# Patient Record
Sex: Female | Born: 1990 | Race: White | Hispanic: No | Marital: Single | Smoking: Current some day smoker
Health system: Southern US, Community
[De-identification: ages and names within clinical notes are randomized; demographics above are authoritative.]

## PROBLEM LIST (undated history)

## (undated) DIAGNOSIS — Z789 Other specified health status: Secondary | ICD-10-CM

---
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
## 2009-01-28 HISTORY — PX: APPENDECTOMY: SHX54

## 2009-06-12 DIAGNOSIS — Z9889 Other specified postprocedural states: Secondary | ICD-10-CM

## 2009-06-19 ENCOUNTER — Ambulatory Visit: Payer: Self-pay | Admitting: Obstetrics & Gynecology

## 2009-06-20 ENCOUNTER — Ambulatory Visit: Payer: Self-pay | Admitting: Obstetrics & Gynecology

## 2010-02-08 ENCOUNTER — Observation Stay (HOSPITAL_COMMUNITY): Admission: EM | Admit: 2010-02-08 | Discharge: 2010-02-09 | Payer: Self-pay | Source: Home / Self Care

## 2010-02-09 ENCOUNTER — Encounter (INDEPENDENT_AMBULATORY_CARE_PROVIDER_SITE_OTHER): Payer: Self-pay

## 2010-02-12 LAB — COMPREHENSIVE METABOLIC PANEL
ALT: 14 U/L (ref 0–35)
AST: 13 U/L (ref 0–37)
Albumin: 3.5 g/dL (ref 3.5–5.2)
Alkaline Phosphatase: 64 U/L (ref 39–117)
BUN: 8 mg/dL (ref 6–23)
CO2: 24 mEq/L (ref 19–32)
Calcium: 8.3 mg/dL — ABNORMAL LOW (ref 8.4–10.5)
Chloride: 109 mEq/L (ref 96–112)
Creatinine, Ser: 0.63 mg/dL (ref 0.4–1.2)
GFR calc Af Amer: >60 mL/min (ref >60)
GFR calc non Af Amer: >60 mL/min (ref >60)
Glucose, Bld: 90 mg/dL (ref 70–99)
Potassium: 3.7 mEq/L (ref 3.5–5.1)
Sodium: 138 mEq/L (ref 135–145)
Total Bilirubin: 0.7 mg/dL (ref 0.3–1.2)
Total Protein: 6.2 g/dL (ref 6.0–8.3)

## 2010-02-12 LAB — CBC
HCT: 33.3 % — ABNORMAL LOW (ref 36.0–46.0)
Hemoglobin: 11.9 g/dL — ABNORMAL LOW (ref 12.0–15.0)
MCH: 32.8 pg (ref 26.0–34.0)
MCHC: 35.7 g/dL (ref 30.0–36.0)
MCV: 91.7 fL (ref 78.0–100.0)
Platelets: 162 10*3/uL (ref 150–400)
RBC: 3.63 MIL/uL — ABNORMAL LOW (ref 3.87–5.11)
RDW: 11.5 % (ref 11.5–15.5)
WBC: 10.5 10*3/uL (ref 4.0–10.5)

## 2010-02-12 LAB — URINALYSIS, ROUTINE W REFLEX MICROSCOPIC
Bilirubin Urine: NEGATIVE
Hgb urine dipstick: NEGATIVE
Ketones, ur: 15 mg/dL — AB
Nitrite: NEGATIVE
Protein, ur: NEGATIVE mg/dL
Specific Gravity, Urine: 1.033 — ABNORMAL HIGH (ref 1.005–1.030)
Urine Glucose, Fasting: NEGATIVE mg/dL
Urobilinogen, UA: 0.2 mg/dL (ref 0.0–1.0)
pH: 7 (ref 5.0–8.0)

## 2010-02-12 LAB — DIFFERENTIAL
Basophils Absolute: 0 10*3/uL (ref 0.0–0.1)
Basophils Relative: 0 % (ref 0–1)
Eosinophils Absolute: 0 10*3/uL (ref 0.0–0.7)
Eosinophils Relative: 0 % (ref 0–5)
Lymphocytes Relative: 7 % — ABNORMAL LOW (ref 12–46)
Lymphs Abs: 0.8 10*3/uL (ref 0.7–4.0)
Monocytes Absolute: 0.8 10*3/uL (ref 0.1–1.0)
Monocytes Relative: 8 % (ref 3–12)
Neutro Abs: 8.9 10*3/uL — ABNORMAL HIGH (ref 1.7–7.7)
Neutrophils Relative %: 85 % — ABNORMAL HIGH (ref 43–77)

## 2010-02-12 LAB — POCT PREGNANCY, URINE: Preg Test, Ur: NEGATIVE

## 2010-02-23 ENCOUNTER — Inpatient Hospital Stay (HOSPITAL_COMMUNITY): Admission: EM | Admit: 2010-02-23 | Discharge: 2010-02-26 | Payer: Self-pay | Source: Home / Self Care

## 2010-02-23 LAB — COMPREHENSIVE METABOLIC PANEL
ALT: 10 U/L (ref 0–35)
AST: 14 U/L (ref 0–37)
Albumin: 3.3 g/dL — ABNORMAL LOW (ref 3.5–5.2)
Alkaline Phosphatase: 97 U/L (ref 39–117)
BUN: 5 mg/dL — ABNORMAL LOW (ref 6–23)
CO2: 25 mEq/L (ref 19–32)
Calcium: 9.2 mg/dL (ref 8.4–10.5)
Chloride: 98 mEq/L (ref 96–112)
Creatinine, Ser: 0.73 mg/dL (ref 0.4–1.2)
GFR calc Af Amer: >60 mL/min (ref >60)
GFR calc non Af Amer: >60 mL/min (ref >60)
Glucose, Bld: 83 mg/dL (ref 70–99)
Potassium: 3 mEq/L — ABNORMAL LOW (ref 3.5–5.1)
Sodium: 136 mEq/L (ref 135–145)
Total Bilirubin: 0.4 mg/dL (ref 0.3–1.2)
Total Protein: 7.7 g/dL (ref 6.0–8.3)

## 2010-02-23 LAB — DIFFERENTIAL
Basophils Absolute: 0.1 10*3/uL (ref 0.0–0.1)
Basophils Relative: 1 % (ref 0–1)
Eosinophils Absolute: 0.1 10*3/uL (ref 0.0–0.7)
Eosinophils Relative: 1 % (ref 0–5)
Lymphocytes Relative: 11 % — ABNORMAL LOW (ref 12–46)
Lymphs Abs: 1.1 10*3/uL (ref 0.7–4.0)
Monocytes Absolute: 1 10*3/uL (ref 0.1–1.0)
Monocytes Relative: 10 % (ref 3–12)
Neutro Abs: 7.9 10*3/uL — ABNORMAL HIGH (ref 1.7–7.7)
Neutrophils Relative %: 77 % (ref 43–77)

## 2010-02-23 LAB — URINALYSIS, ROUTINE W REFLEX MICROSCOPIC
Ketones, ur: 15 mg/dL — AB
Leukocytes, UA: NEGATIVE
Nitrite: NEGATIVE
Protein, ur: NEGATIVE mg/dL
Specific Gravity, Urine: 1.027 (ref 1.005–1.030)
Urine Glucose, Fasting: NEGATIVE mg/dL
Urobilinogen, UA: 1 mg/dL (ref 0.0–1.0)
pH: 5.5 (ref 5.0–8.0)

## 2010-02-23 LAB — URINE MICROSCOPIC-ADD ON

## 2010-02-23 LAB — CBC
HCT: 41.8 % (ref 36.0–46.0)
Hemoglobin: 14.9 g/dL (ref 12.0–15.0)
MCH: 32.2 pg (ref 26.0–34.0)
MCHC: 35.6 g/dL (ref 30.0–36.0)
MCV: 90.3 fL (ref 78.0–100.0)
Platelets: 231 10*3/uL (ref 150–400)
RBC: 4.63 MIL/uL (ref 3.87–5.11)
RDW: 11.4 % — ABNORMAL LOW (ref 11.5–15.5)
WBC: 10.2 10*3/uL (ref 4.0–10.5)

## 2010-02-24 LAB — URINE CULTURE
Colony Count: NO GROWTH
Culture  Setup Time: 201201271740
Culture: NO GROWTH

## 2010-02-24 LAB — BASIC METABOLIC PANEL
BUN: 3 mg/dL — ABNORMAL LOW (ref 6–23)
CO2: 24 mEq/L (ref 19–32)
Calcium: 8.9 mg/dL (ref 8.4–10.5)
Chloride: 100 mEq/L (ref 96–112)
Creatinine, Ser: 0.7 mg/dL (ref 0.4–1.2)
GFR calc Af Amer: >60 mL/min (ref >60)
GFR calc non Af Amer: >60 mL/min (ref >60)
Glucose, Bld: 88 mg/dL (ref 70–99)
Potassium: 3.5 mEq/L (ref 3.5–5.1)
Sodium: 135 mEq/L (ref 135–145)

## 2010-02-25 LAB — COMPREHENSIVE METABOLIC PANEL
ALT: 10 U/L (ref 0–35)
AST: 15 U/L (ref 0–37)
Albumin: 2.3 g/dL — ABNORMAL LOW (ref 3.5–5.2)
Alkaline Phosphatase: 69 U/L (ref 39–117)
BUN: <1 mg/dL — ABNORMAL LOW (ref 6–23)
CO2: 23 mEq/L (ref 19–32)
Calcium: 8.4 mg/dL (ref 8.4–10.5)
Chloride: 104 mEq/L (ref 96–112)
Creatinine, Ser: 0.67 mg/dL (ref 0.4–1.2)
GFR calc Af Amer: >60 mL/min (ref >60)
GFR calc non Af Amer: >60 mL/min (ref >60)
Glucose, Bld: 105 mg/dL — ABNORMAL HIGH (ref 70–99)
Potassium: 3.8 mEq/L (ref 3.5–5.1)
Sodium: 136 mEq/L (ref 135–145)
Total Bilirubin: 0.7 mg/dL (ref 0.3–1.2)
Total Protein: 5.9 g/dL — ABNORMAL LOW (ref 6.0–8.3)

## 2010-02-25 LAB — CBC
HCT: 26.7 % — ABNORMAL LOW (ref 36.0–46.0)
Hemoglobin: 9.2 g/dL — ABNORMAL LOW (ref 12.0–15.0)
MCH: 31.7 pg (ref 26.0–34.0)
MCHC: 34.5 g/dL (ref 30.0–36.0)
MCV: 92.1 fL (ref 78.0–100.0)
Platelets: 200 10*3/uL (ref 150–400)
RBC: 2.9 MIL/uL — ABNORMAL LOW (ref 3.87–5.11)
RDW: 11.5 % (ref 11.5–15.5)
WBC: 5.8 10*3/uL (ref 4.0–10.5)

## 2010-02-26 LAB — CULTURE, ROUTINE-ABSCESS

## 2010-02-27 NOTE — H&P (Signed)
  NAME:  Darlene Leonard, Darlene Leonard NO.:  [id number]  MEDICAL RECORD NO.:  [id number]          PATIENT TYPE:  INP  LOCATION:  5157                         FACILITY:  [facility]  PHYSICIAN:  Adolph Pollack, M.D.DATE OF BIRTH:  05-07-1990  DATE OF ADMISSION:  02/23/2010 DATE OF DISCHARGE:                             HISTORY & PHYSICAL   REASON FOR ADMISSION:  Pelvic abscess.  HISTORY:  This is a 20 year old female who underwent a laparoscopic appendectomy for acute appendicitis on February 09, 2010.  Four days ago, she developed some nighttime fever, poor appetite, rectal urgency, and lower abdominal pain which has persisted.  She had a temperature of 102 degrees.  She presented to the urgent office and was seen and sent over to the emergency department for evaluation and CT scan and laboratory. CT scan is consistent with a pelvic abscess.  PAST MEDICAL HISTORY:  Appendicitis.  PREVIOUS OPERATIONS:  Laparoscopic appendectomy, D and C.  ALLERGIES:  None.  MEDICATIONS:  Tylenol p.r.n.  SOCIAL HISTORY:  She works at [employer].  Does smoke cigarettes.  Rare alcohol use.  REVIEW OF SYSTEMS:  There has been no dysuria or hematuria.  No vomiting.  She is having some liquid bowel movements but feels a sense of rectal urgency most of the time.  She tried enema on Wednesday and that is when the liquid bowel movement started.  She had been fairly constipated up to that time.  PHYSICAL EXAMINATION:  GENERAL:  A well-developed, well-nourished female, who is pleasant and cooperative and in no acute distress. VITAL SIGNS:  Temperature is 100.4, blood pressure is 116/77, pulse 98, respiratory rate 20, and O2 saturation 100% on room air. NECK:  Supple without mass. RESPIRATORY:  Breath sounds are equal and clear.  Respirations are unlabored. CARDIOVASCULAR:  Heart demonstrates regular rate, regular rhythm.  I hear no murmur. ABDOMEN:  Soft with well-healed subumbilical, right upper  quadrant, and left lower quadrant scars.  There is some mild lower abdominal tenderness, but no guarding and active bowel sounds are noted. MUSCULOSKELETAL:  She has no edema and good range of motion.  LABORATORY DATA:  White cell count is 10,200, hemoglobin 14.9. Potassium of 3.0, rest of electrolytes within normal limits.  Urinalysis negative.  CT scan demonstrates a multiloculated fluid collection in the pelvis of which largest collection being about 6 cm.  IMPRESSION: 1. Postoperative pelvic abscess. 2. Hypokalemia.  PLAN:  We will admit to the hospital and start her on IV antibiotics. We will obtain an Interventional Radiology consult for percutaneous drainage and I have discussed this with the radiologist ready.  We will correct the potassium deficit.  I have discussed the plan with her and her family.  If the drainage procedure is unable to be done, they realize that she may need a repeat operation to drain the abscess.     Adolph Pollack, M.D.     Kari Baars  D:  02/23/2010  T:  02/24/2010  Job:  [id number] Electronically Signed by Avel Peace M.D. on 02/27/2010 09:22:30 AM

## 2010-03-01 ENCOUNTER — Other Ambulatory Visit (HOSPITAL_COMMUNITY): Payer: Self-pay | Admitting: General Surgery

## 2010-03-01 DIAGNOSIS — L0291 Cutaneous abscess, unspecified: Secondary | ICD-10-CM

## 2010-03-04 LAB — ANAEROBIC CULTURE

## 2010-03-05 ENCOUNTER — Other Ambulatory Visit (HOSPITAL_COMMUNITY): Payer: Self-pay | Admitting: General Surgery

## 2010-03-05 ENCOUNTER — Other Ambulatory Visit (HOSPITAL_COMMUNITY): Payer: Self-pay

## 2010-03-05 ENCOUNTER — Ambulatory Visit (HOSPITAL_COMMUNITY)
Admission: RE | Admit: 2010-03-05 | Discharge: 2010-03-05 | Disposition: A | Discharge status: Home / Self Care | Payer: BC Managed Care – PPO | Source: Ambulatory Visit | Attending: General Surgery | Admitting: General Surgery

## 2010-03-05 DIAGNOSIS — L0291 Cutaneous abscess, unspecified: Secondary | ICD-10-CM

## 2010-03-05 DIAGNOSIS — Z438 Encounter for attention to other artificial openings: Secondary | ICD-10-CM | POA: Insufficient documentation

## 2010-03-05 MED ORDER — IOHEXOL 300 MG/ML  SOLN
80.0000 mL | Freq: Once | INTRAMUSCULAR | Status: AC | PRN
  Administered 2010-03-05: 80 mL via INTRAVENOUS

## 2010-03-22 NOTE — Discharge Summary (Signed)
  NAME:  Darlene Leonard, Darlene Leonard NO.:  [id number]  MEDICAL RECORD NO.:  [id number]           PATIENT TYPE:  LOCATION:                                 FACILITY:  PHYSICIAN:  Velora Heckler, MD      DATE OF BIRTH:  1990/11/01  DATE OF ADMISSION: DATE OF DISCHARGE:                              DISCHARGE SUMMARY   DATE OF ADMISSION:  February 23, 2010.  DATE OF DISCHARGE:  February 26, 2010.  HISTORY OF PRESENT ILLNESS:  [name] is a 20 year old female who underwent laparoscopic appendectomy, originally on January 13.  She was discharged appropriately and seen in a postop manner without any significant complaints.  However, she did develop some nighttime fevers that turned into consistent fevers as well as the addition of lower abdominal discomfort.  She re-presented to the office with these complaints and was sent over to the emergency department for CT scan and evaluation.  The CT scan showed evidence consistent with a pelvic abscess, and decision was made to admit the patient by Dr. Abbey Chatters on February 23, 2010.  SUMMARY OF HOSPITAL COURSE:  After admission on January 27, the patient was consulted by Interventional Radiology for possible percutaneous drainage placement.  This was accomplished the following day, on January 28, and a drain into the pelvic abscess was accomplished.  The patient felt considerably better.  Post procedure, her fevers subsided and her white blood cell count normalized.  Her diet was started and advanced to the point where that as of February 26, 2010, she was tolerating a regular diet, having good bowel function, minimal pain.  She was afebrile.  Her white blood cell count had normalized.  At this point, we felt the patient was appropriate for discharge, with the drain intact and with plans for CT scan and of drain injection to be done as an outpatient.  DISCHARGE DIAGNOSES: 1. Status post laparoscopic appendectomy with postop pelvic  abscess. 2. Status post percutaneous drainage of pelvic abscess.  DISCHARGE/PLAN:  The patient will be given prescription for antibiotics as well as a prescription for Percocet 1-2 tablets q.4-6 h. p.r.n. pain. She is given instructions and advised that she will be contacted for scheduling of her CT scan/drain injection by our office.  She is otherwise scheduled to come back in the office after her imaging study is completed.     Brayton El, PA-C   ______________________________ Velora Heckler, MD    KB/MEDQ  D:  03/12/2010  T:  03/13/2010  Job:  [id number]  Electronically Signed by Brayton El  on 03/15/2010 09:16:34 AM Electronically Signed by Darnell Level MD on 03/22/2010 10:30:49 AM

## 2012-03-17 ENCOUNTER — Ambulatory Visit: Payer: BC Managed Care – PPO | Admitting: Internal Medicine

## 2014-01-28 LAB — HM PAP SMEAR: HM Pap smear: NORMAL

## 2014-01-28 NOTE — L&D Delivery Note (Signed)
Delivery Note At  a viable female was delivered via  (Presentation:LOA ;  ).  APGAR:8 ,9 ; weight  .   Placenta status: delivered in tact,with 3 vessel  Cord:  with the following complications:none .  Cord pH: NA  Anesthesia:  none Episiotomy:  none Lacerations:  superficial Suture Repair: NA Est. Blood Loss (mL):  250  Mom to stay in L&D for 24 hours for mag administration.  Baby to Couplet care / Skin to Skin.  Melody Burr 09/29/2014, 9:26 PM

## 2014-03-02 LAB — OB RESULTS CONSOLE GC/CHLAMYDIA
Chlamydia: NEGATIVE
Gonorrhea: NEGATIVE

## 2014-03-02 LAB — OB RESULTS CONSOLE ABO/RH: RH TYPE: POSITIVE

## 2014-03-02 LAB — OB RESULTS CONSOLE HGB/HCT, BLOOD
HCT: 37 %
Hemoglobin: 13.2 g/dL

## 2014-03-02 LAB — OB RESULTS CONSOLE RPR: RPR: NONREACTIVE

## 2014-03-02 LAB — OB RESULTS CONSOLE VARICELLA ZOSTER ANTIBODY, IGG: VARICELLA IGG: IMMUNE

## 2014-03-02 LAB — OB RESULTS CONSOLE PLATELET COUNT: PLATELETS: 234 10*3/uL

## 2014-03-02 LAB — OB RESULTS CONSOLE HEPATITIS B SURFACE ANTIGEN: Hepatitis B Surface Ag: NEGATIVE

## 2014-03-02 LAB — OB RESULTS CONSOLE HIV ANTIBODY (ROUTINE TESTING): HIV: NONREACTIVE

## 2014-03-02 LAB — OB RESULTS CONSOLE ANTIBODY SCREEN: Antibody Screen: NEGATIVE

## 2014-03-02 LAB — OB RESULTS CONSOLE RUBELLA ANTIBODY, IGM: RUBELLA: IMMUNE

## 2014-07-06 DIAGNOSIS — O219 Vomiting of pregnancy, unspecified: Secondary | ICD-10-CM | POA: Insufficient documentation

## 2014-07-06 DIAGNOSIS — IMO0002 Reserved for concepts with insufficient information to code with codable children: Secondary | ICD-10-CM | POA: Insufficient documentation

## 2014-07-07 ENCOUNTER — Ambulatory Visit (INDEPENDENT_AMBULATORY_CARE_PROVIDER_SITE_OTHER): Payer: BC Managed Care – PPO | Admitting: Obstetrics and Gynecology

## 2014-07-07 ENCOUNTER — Encounter: Payer: Self-pay | Admitting: Obstetrics and Gynecology

## 2014-07-07 VITALS — BP 130/76 | HR 108 | Wt 141.9 lb

## 2014-07-07 DIAGNOSIS — Z3492 Encounter for supervision of normal pregnancy, unspecified, second trimester: Secondary | ICD-10-CM

## 2014-07-07 DIAGNOSIS — Z3482 Encounter for supervision of other normal pregnancy, second trimester: Secondary | ICD-10-CM

## 2014-07-07 LAB — POCT URINALYSIS DIPSTICK
BILIRUBIN UA: NEGATIVE
Blood, UA: NEGATIVE
GLUCOSE UA: NEGATIVE
KETONES UA: NEGATIVE
Leukocytes, UA: NEGATIVE
NITRITE UA: NEGATIVE
PH UA: 7
Spec Grav, UA: <=1.005
Urobilinogen, UA: 0.2

## 2014-07-07 NOTE — Progress Notes (Signed)
ROB- 24yo MWF, [id number]; doing well w/o concerns; glucola next visit. Enrolled in [employer]; plans to name female infant 'Marlene Bast'

## 2014-07-07 NOTE — Patient Instructions (Signed)
Place 24-38 weeks prenatal visit patient instructions here.

## 2014-07-07 NOTE — Progress Notes (Signed)
Patient is feeling good no complaints

## 2014-07-08 ENCOUNTER — Encounter: Payer: BC Managed Care – PPO | Admitting: Obstetrics and Gynecology

## 2014-07-21 ENCOUNTER — Encounter: Payer: Self-pay | Admitting: Obstetrics and Gynecology

## 2014-07-21 ENCOUNTER — Ambulatory Visit (INDEPENDENT_AMBULATORY_CARE_PROVIDER_SITE_OTHER): Payer: BC Managed Care – PPO | Admitting: Obstetrics and Gynecology

## 2014-07-21 ENCOUNTER — Other Ambulatory Visit: Payer: BC Managed Care – PPO

## 2014-07-21 VITALS — BP 118/76 | HR 97

## 2014-07-21 DIAGNOSIS — Z3493 Encounter for supervision of normal pregnancy, unspecified, third trimester: Secondary | ICD-10-CM

## 2014-07-21 LAB — POCT URINALYSIS DIPSTICK
BILIRUBIN UA: NEGATIVE
Blood, UA: NEGATIVE
GLUCOSE UA: NEGATIVE
KETONES UA: NEGATIVE
LEUKOCYTES UA: NEGATIVE
Nitrite, UA: NEGATIVE
Protein, UA: NEGATIVE
Spec Grav, UA: <=1.005
Urobilinogen, UA: 0.2
pH, UA: 7

## 2014-07-21 MED ORDER — TETANUS-DIPHTH-ACELL PERTUSSIS 5-2.5-18.5 LF-MCG/0.5 IM SUSP
0.5000 mL | Freq: Once | INTRAMUSCULAR | Status: AC
  Administered 2014-07-21: 0.5 mL via INTRAMUSCULAR

## 2014-07-21 NOTE — Progress Notes (Signed)
ROB & Glucola- doing well, Tdap given & blood consent signed; discussed increased progesterone and blood volume in pregnancy and it's effects on swelling and 'feeling pulse'

## 2014-07-21 NOTE — Progress Notes (Signed)
Pt states she can feel her pulse in her ears, head, legs

## 2014-07-22 ENCOUNTER — Other Ambulatory Visit: Payer: Self-pay | Admitting: Obstetrics and Gynecology

## 2014-07-22 DIAGNOSIS — O99810 Abnormal glucose complicating pregnancy: Secondary | ICD-10-CM

## 2014-07-22 DIAGNOSIS — O99019 Anemia complicating pregnancy, unspecified trimester: Secondary | ICD-10-CM

## 2014-07-22 LAB — HEMOGLOBIN AND HEMATOCRIT, BLOOD
HEMATOCRIT: 28.3 % — AB (ref 34.0–46.6)
HEMOGLOBIN: 9.7 g/dL — AB (ref 11.1–15.9)

## 2014-07-22 LAB — GLUCOSE, 1 HOUR GESTATIONAL: GESTATIONAL DIABETES SCREEN: 139 mg/dL (ref 65–139)

## 2014-07-22 MED ORDER — FUSION PLUS PO CAPS: 1.0000 | ORAL_CAPSULE | Freq: Every day | ORAL | Status: DC

## 2014-07-25 ENCOUNTER — Telehealth: Payer: Self-pay | Admitting: *Deleted

## 2014-07-25 NOTE — Telephone Encounter (Signed)
-----   Message from [place]arlene [place], [place] sent at 07/22/2014  1:55 PM EDT ----- Please let her know she failed her glucola and is anemic- needs to come in and do 3hGTT and let her know I sent in rx for iron supplements, to take 1 pill daily (at different time of day than PNV)

## 2014-07-25 NOTE — Telephone Encounter (Signed)
Notified pt of lab results, she is going to check with her boss and call back and schedule 3hr GTT

## 2014-07-27 ENCOUNTER — Other Ambulatory Visit: Payer: BC Managed Care – PPO

## 2014-07-27 DIAGNOSIS — O99810 Abnormal glucose complicating pregnancy: Secondary | ICD-10-CM

## 2014-07-28 ENCOUNTER — Other Ambulatory Visit: Payer: Self-pay | Admitting: Obstetrics and Gynecology

## 2014-07-28 DIAGNOSIS — O24419 Gestational diabetes mellitus in pregnancy, unspecified control: Secondary | ICD-10-CM

## 2014-07-28 LAB — GESTATIONAL GLUCOSE TOLERANCE
GLUCOSE 3 HOUR GTT: 148 mg/dL — AB (ref 65–139)
Glucose, Fasting: 78 mg/dL (ref 65–94)
Glucose, GTT - 1 Hour: 183 mg/dL — ABNORMAL HIGH (ref 65–179)
Glucose, GTT - 2 Hour: 172 mg/dL — ABNORMAL HIGH (ref 65–154)

## 2014-07-29 ENCOUNTER — Telehealth: Payer: Self-pay | Admitting: *Deleted

## 2014-07-29 NOTE — Telephone Encounter (Signed)
Notified pt of results, mailed info to pt, [facility] has already contacted pt

## 2014-07-29 NOTE — Telephone Encounter (Signed)
-----   Message from Ulyses AmorMelody N Burr, [place] sent at 07/28/2014  1:45 PM EDT ----- Please let her know she has gestational diabetes, and I am putting in a referral to the [facility] center to start dietary management.  Also will print off info to mail to her.  Have go ahead and reduce all forms of sugar in her diet until [facility] appt.

## 2014-08-04 ENCOUNTER — Encounter: Payer: Self-pay | Admitting: Obstetrics and Gynecology

## 2014-08-04 ENCOUNTER — Ambulatory Visit (INDEPENDENT_AMBULATORY_CARE_PROVIDER_SITE_OTHER): Payer: BC Managed Care – PPO | Admitting: Obstetrics and Gynecology

## 2014-08-04 VITALS — BP 131/69 | HR 93 | Wt 143.7 lb

## 2014-08-04 DIAGNOSIS — O24419 Gestational diabetes mellitus in pregnancy, unspecified control: Secondary | ICD-10-CM | POA: Insufficient documentation

## 2014-08-04 DIAGNOSIS — Z3493 Encounter for supervision of normal pregnancy, unspecified, third trimester: Secondary | ICD-10-CM

## 2014-08-04 DIAGNOSIS — O24410 Gestational diabetes mellitus in pregnancy, diet controlled: Secondary | ICD-10-CM

## 2014-08-04 LAB — POCT URINALYSIS DIPSTICK
Bilirubin, UA: NEGATIVE
Blood, UA: NEGATIVE
Glucose, UA: NEGATIVE
Ketones, UA: 40
LEUKOCYTES UA: NEGATIVE
NITRITE UA: NEGATIVE
PH UA: 5
Spec Grav, UA: 1.015
UROBILINOGEN UA: 0.2

## 2014-08-04 NOTE — Progress Notes (Signed)
Pt denies any changes, states she has appt with lifestyle next week for elevated glucose test

## 2014-08-04 NOTE — Progress Notes (Signed)
ROB- reviewed GDM and care with growth scans at 34 & 38 weeks, along with IOL by due date if not delivered by then.

## 2014-08-04 NOTE — Patient Instructions (Signed)
Gestational Diabetes Mellitus Gestational diabetes mellitus, often simply referred to as gestational diabetes, is a type of diabetes that some women develop during pregnancy. In gestational diabetes, the pancreas does not make enough insulin (a hormone), the cells are less responsive to the insulin that is made (insulin resistance), or both.Normally, insulin moves sugars from food into the tissue cells. The tissue cells use the sugars for energy. The lack of insulin or the lack of normal response to insulin causes excess sugars to build up in the blood instead of going into the tissue cells. As a result, high blood sugar (hyperglycemia) develops. The effect of high sugar (glucose) levels can cause many problems.  RISK FACTORS You have an increased chance of developing gestational diabetes if you have a family history of diabetes and also have one or more of the following risk factors:  A body mass index over 30 (obesity).  A previous pregnancy with gestational diabetes.  An older age at the time of pregnancy. If blood glucose levels are kept in the normal range during pregnancy, women can have a healthy pregnancy. If your blood glucose levels are not well controlled, there may be risks to you, your unborn baby (fetus), your labor and delivery, or your newborn baby.  SYMPTOMS  If symptoms are experienced, they are much like symptoms you would normally expect during pregnancy. The symptoms of gestational diabetes include:   Increased thirst (polydipsia).  Increased urination (polyuria).  Increased urination during the night (nocturia).  Weight loss. This weight loss may be rapid.  Frequent, recurring infections.  Tiredness (fatigue).  Weakness.  Vision changes, such as blurred vision.  Fruity smell to your breath.  Abdominal pain. DIAGNOSIS Diabetes is diagnosed when blood glucose levels are increased. Your blood glucose level may be checked by one or more of the following blood  tests:  A fasting blood glucose test. You will not be allowed to eat for at least 8 hours before a blood sample is taken.  A random blood glucose test. Your blood glucose is checked at any time of the day regardless of when you ate.  A hemoglobin A1c blood glucose test. A hemoglobin A1c test provides information about blood glucose control over the previous 3 months.  An oral glucose tolerance test (OGTT). Your blood glucose is measured after you have not eaten (fasted) for 1-3 hours and then after you drink a glucose-containing beverage. Since the hormones that cause insulin resistance are highest at about 24-28 weeks of a pregnancy, an OGTT is usually performed during that time. If you have risk factors for gestational diabetes, your health care provider may test you for gestational diabetes earlier than 24 weeks of pregnancy. TREATMENT   You will need to take diabetes medicine or insulin daily to keep blood glucose levels in the desired range.  You will need to match insulin dosing with exercise and healthy food choices. The treatment goal is to maintain the before-meal (preprandial), bedtime, and overnight blood glucose level at 60-99 mg/dL during pregnancy. The treatment goal is to further maintain peak after-meal blood sugar (postprandial glucose) level at 100-140 mg/dL. HOME CARE INSTRUCTIONS   Have your hemoglobin A1c level checked twice a year.  Perform daily blood glucose monitoring as directed by your health care provider. It is common to perform frequent blood glucose monitoring.  Monitor urine ketones when you are ill and as directed by your health care provider.  Take your diabetes medicine and insulin as directed by your health care provider   to maintain your blood glucose level in the desired range.  Never run out of diabetes medicine or insulin. It is needed every day.  Adjust insulin based on your intake of carbohydrates. Carbohydrates can raise blood glucose levels but  need to be included in your diet. Carbohydrates provide vitamins, minerals, and fiber which are an essential part of a healthy diet. Carbohydrates are found in fruits, vegetables, whole grains, dairy products, legumes, and foods containing added sugars.  Eat healthy foods. Alternate 3 meals with 3 snacks.  Maintain a healthy weight gain. The usual total expected weight gain varies according to your prepregnancy body mass index (BMI).  Carry a medical alert card or wear your medical alert jewelry.  Carry a 15-gram carbohydrate snack with you at all times to treat low blood glucose (hypoglycemia). Some examples of 15-gram carbohydrate snacks include:  Glucose tablets, 3 or 4.  Glucose gel, 15-gram tube.  Raisins, 2 tablespoons (24 g).  Jelly beans, 6.  Animal crackers, 8.  Fruit juice, regular soda, or low-fat milk, 4 ounces (120 mL).  Gummy treats, 9.  Recognize hypoglycemia. Hypoglycemia during pregnancy occurs with blood glucose levels of 60 mg/dL and below. The risk for hypoglycemia increases when fasting or skipping meals, during or after intense exercise, and during sleep. Hypoglycemia symptoms can include:  Tremors or shakes.  Decreased ability to concentrate.  Sweating.  Increased heart rate.  Headache.  Dry mouth.  Hunger.  Irritability.  Anxiety.  Restless sleep.  Altered speech or coordination.  Confusion.  Treat hypoglycemia promptly. If you are alert and able to safely swallow, follow the 15:15 rule:  Take 15-20 grams of rapid-acting glucose or carbohydrate. Rapid-acting options include glucose gel, glucose tablets, or 4 ounces (120 mL) of fruit juice, regular soda, or low-fat milk.  Check your blood glucose level 15 minutes after taking the glucose.  Take 15-20 grams more of glucose if the repeat blood glucose level is still 70 mg/dL or below.  Eat a meal or snack within 1 hour once blood glucose levels return to normal.  Be alert to polyuria  (excess urination) and polydipsia (excess thirst) which are early signs of hyperglycemia. An early awareness of hyperglycemia allows for prompt treatment. Treat hyperglycemia as directed by your health care provider.  Engage in at least 30 minutes of physical activity a day or as directed by your health care provider. Ten minutes of physical activity timed 30 minutes after each meal is encouraged to control postprandial blood glucose levels.  Adjust your insulin dosing and food intake as needed if you start a new exercise or sport.  Follow your sick-day plan at any time you are unable to eat or drink as usual.  Avoid tobacco and alcohol use.  Keep all follow-up visits as directed by your health care provider.  Follow the advice of your health care provider regarding your prenatal and post-delivery (postpartum) appointments, meal planning, exercise, medicines, vitamins, blood tests, other medical tests, and physical activities.  Perform daily skin and foot care. Examine your skin and feet daily for cuts, bruises, redness, nail problems, bleeding, blisters, or sores.  Brush your teeth and gums at least twice a day and floss at least once a day. Follow up with your dentist regularly.  Schedule an eye exam during the first trimester of your pregnancy or as directed by your health care provider.  Share your diabetes management plan with your workplace or school.  Stay up-to-date with immunizations.  Learn to manage stress.    Obtain ongoing diabetes education and support as needed.  Learn about and consider breastfeeding your baby.  You should have your blood sugar level checked 6-12 weeks after delivery. This is done with an oral glucose tolerance test (OGTT). SEEK MEDICAL CARE IF:   You are unable to eat food or drink fluids for more than 6 hours.  You have nausea and vomiting for more than 6 hours.  You have a blood glucose level of 200 mg/dL and you have ketones in your  urine.  There is a change in mental status.  You develop vision problems.  You have a persistent headache.  You have upper abdominal pain or discomfort.  You develop an additional serious illness.  You have diarrhea for more than 6 hours.  You have been sick or have had a fever for a couple of days and are not getting better. SEEK IMMEDIATE MEDICAL CARE IF:   You have difficulty breathing.  You no longer feel the baby moving.  You are bleeding or have discharge from your vagina.  You start having premature contractions or labor. MAKE SURE YOU:  Understand these instructions.  Will watch your condition.  Will get help right away if you are not doing well or get worse. Document Released: 04/22/2000 Document Revised: 05/31/2013 Document Reviewed: 08/13/2011 ExitCare Patient Information 2015 [employer]. This information is not intended to replace advice given to you by your health care provider. Make sure you discuss any questions you have with your health care provider.  

## 2014-08-11 ENCOUNTER — Encounter: Payer: BC Managed Care – PPO | Attending: Obstetrics and Gynecology | Admitting: *Deleted

## 2014-08-11 ENCOUNTER — Encounter: Payer: Self-pay | Admitting: *Deleted

## 2014-08-11 ENCOUNTER — Other Ambulatory Visit: Payer: Self-pay | Admitting: Obstetrics and Gynecology

## 2014-08-11 VITALS — BP 114/63 | Ht 63.0 in | Wt 144.8 lb

## 2014-08-11 DIAGNOSIS — O24419 Gestational diabetes mellitus in pregnancy, unspecified control: Secondary | ICD-10-CM | POA: Diagnosis not present

## 2014-08-11 DIAGNOSIS — O24410 Gestational diabetes mellitus in pregnancy, diet controlled: Secondary | ICD-10-CM

## 2014-08-11 MED ORDER — GLUCOSE BLOOD VI STRP: ORAL_STRIP | Status: DC

## 2014-08-11 NOTE — Patient Instructions (Signed)
Read booklet on Gestational Diabetes Follow Gestational Meal Planning Guidelines Complete a 3 Day Food Record and bring to next appointment Check blood sugars 4 x day - before breakfast and 2 hrs after every meal and record  Call MD for prescription for meter strips and lancets Strips   One Touch Ultra Blue     Lancets  One Touch Delica Bring blood sugar log to next appointment Walk 20-30 minutes at least 5 x week if permitted by MD

## 2014-08-11 NOTE — Progress Notes (Signed)
Diabetes Self-Management Education  Visit Type: First/Initial  Appt. Start Time: 1445 Appt. End Time: 1630  08/11/2014  [name], identified by name and date of birth, is a 24 y.o. female with a diagnosis of Diabetes: Gestational Diabetes.    ASSESSMENT Blood pressure 114/63, height 5\' 3"  (1.6 m), weight 144 lb 12.8 oz (65.681 kg), last menstrual period 01/04/2014. Body mass index is 25.66 kg/(m^2).  Initial Visit Information: Are you currently following a meal plan?: Yes What type of meal plan do you follow?: "cutting out bread, pasta, rice and sweets" Are you taking your medications as prescribed?: Yes Are you checking your feet?: No How often do you need to have someone help you when you read instructions, pamphlets, or other written materials from your doctor or pharmacy?: 1 - Never What is the last grade level you completed in school?: 12th  Psychosocial: Patient Belief/Attitude about Diabetes: Motivated to manage diabetes ("fine") Self-care barriers: None Self-management support: Doctor's office, Family Patient Concerns: Nutrition/Meal planning, Monitoring, Glycemic Control Special Needs: None Preferred Learning Style: Auditory Learning Readiness: Change in progress  Complications:  How often do you check your blood sugar?: 0 times/day (not testing) (Provided One Touch Ultra Mini meter and instructed on use. BG upon return demonstration was 84 mg/dL at 4:544:20 pm - 4 hrs pp.) Have you had a dilated eye exam in the past 12 months?: No Have you had a dental exam in the past 12 months?: No  Diet Intake: Breakfast: eats 3 meals/day Snack (morning): eats 2 snacks/day - decreased sweets in last 3 weeks Beverage(s): drinks mostly water  Exercise: Exercise: Light (walking) Light Exercise amount of time (min / week): 100  Individualized Plan for Diabetes Self-Management Training:  Learning Objective:  Patient will have a greater understanding of diabetes  self-management.   Education Topics Reviewed with Patient Today: Definition of diabetes, type 1 and 2, and the diagnosis of diabetes Role of diet in the treatment of diabetes and the relationship between the three main macronutrients and blood glucose level Role of exercise on diabetes management, blood pressure control and cardiac health. Taught/evaluated SMBG meter., Purpose and frequency of SMBG., Identified appropriate SMBG and/or A1C goals. Relationship between chronic complications and blood glucose control Identified and addressed patients feelings and concerns about diabetes Pregnancy and GDM  Role of pre-pregnancy blood glucose control on the development of the fetus, Reviewed with patient blood glucose goals with pregnancy  PATIENTS GOALS/Plan (Developed by the patient): Improve blood sugars  Plan:  Read booklet on Gestational Diabetes Follow Gestational Meal Planning Guidelines Complete a 3 Day Food Record and bring to next appointment Check blood sugars 4 x day - before breakfast and 2 hrs after every meal and record  Call MD for prescription for meter strips and lancets Strips   One Touch Ultra Blue     Lancets  One Touch Delica Bring blood sugar log to next appointment Walk 20-30 minutes at least 5 x week if permitted by MD  Expected Outcomes:  Demonstrated interest in learning. Expect positive outcomes  Education material provided:  Gestational Meal Planning Guidelines Viewed Gestational Diabetes Video Meter - One Touch Ultra Mini 3 Day Food Record Goals for a Healthy Pregnancy  If problems or questions, patient to contact team via:  Darlene SettlerSheila Shotwell, RN, CCM, CDE [phone]  Future DSME appointment: Thursday August 18, 2014 at 5:00 pm with dietitian

## 2014-08-18 ENCOUNTER — Encounter: Payer: BC Managed Care – PPO | Admitting: Dietician

## 2014-08-18 ENCOUNTER — Encounter: Payer: Self-pay | Admitting: Obstetrics and Gynecology

## 2014-08-18 ENCOUNTER — Ambulatory Visit (INDEPENDENT_AMBULATORY_CARE_PROVIDER_SITE_OTHER): Payer: BC Managed Care – PPO | Admitting: Obstetrics and Gynecology

## 2014-08-18 VITALS — BP 124/64 | Ht 63.0 in | Wt 146.9 lb

## 2014-08-18 VITALS — BP 142/86 | HR 98 | Wt 147.2 lb

## 2014-08-18 DIAGNOSIS — O24410 Gestational diabetes mellitus in pregnancy, diet controlled: Secondary | ICD-10-CM

## 2014-08-18 DIAGNOSIS — O24419 Gestational diabetes mellitus in pregnancy, unspecified control: Secondary | ICD-10-CM | POA: Diagnosis not present

## 2014-08-18 DIAGNOSIS — O24913 Unspecified diabetes mellitus in pregnancy, third trimester: Secondary | ICD-10-CM

## 2014-08-18 DIAGNOSIS — Z3493 Encounter for supervision of normal pregnancy, unspecified, third trimester: Secondary | ICD-10-CM

## 2014-08-18 LAB — POCT URINALYSIS DIPSTICK
Glucose, UA: NEGATIVE
Ketones, UA: NEGATIVE
LEUKOCYTES UA: NEGATIVE
Nitrite, UA: NEGATIVE
RBC UA: NEGATIVE
SPEC GRAV UA: 1.015
UROBILINOGEN UA: 0.2
pH, UA: 6

## 2014-08-18 NOTE — Progress Notes (Signed)
ROB- FBS all <100 and PP with only 2 out of 32 elevated- ok to stop lunch PP check, discussed PIH s/s- recheck bp 128/76, will schedule growth scan next visit.

## 2014-08-18 NOTE — Progress Notes (Signed)
Patient's BG record indicates BGs within goal ranges; 2 post-meal readings above 120, which patient relates to eating out. Patient's food diary indicates balanced meals and controlled carbohydrate intake.  Provided 1900kcal meal plan, and wrote individualized menus based on patient's food preferences. Instructed patient on food safety, including avoidance of Listeriosis, and limiting mercury from fish. Discussed importance of maintaining healthy lifestyle habits to reduce risk of Type 2 DM as well as Gestational DM with any future pregnancies. Advised patient to use any remaining testing supplies to test some BGs after delivery, and to have BG tested ideally annually, as well as prior to attempting future pregnancies.

## 2014-08-18 NOTE — Progress Notes (Signed)
Pt is c/o L foot swelling, states its still puffy in the am

## 2014-08-31 ENCOUNTER — Ambulatory Visit: Payer: BC Managed Care – PPO

## 2014-08-31 ENCOUNTER — Ambulatory Visit (INDEPENDENT_AMBULATORY_CARE_PROVIDER_SITE_OTHER): Payer: BC Managed Care – PPO | Admitting: Obstetrics and Gynecology

## 2014-08-31 ENCOUNTER — Encounter: Payer: Self-pay | Admitting: Obstetrics and Gynecology

## 2014-08-31 VITALS — BP 126/81 | HR 88 | Wt 146.4 lb

## 2014-08-31 DIAGNOSIS — O24913 Unspecified diabetes mellitus in pregnancy, third trimester: Secondary | ICD-10-CM | POA: Diagnosis not present

## 2014-08-31 DIAGNOSIS — Z3493 Encounter for supervision of normal pregnancy, unspecified, third trimester: Secondary | ICD-10-CM

## 2014-08-31 LAB — POCT URINALYSIS DIPSTICK
Bilirubin, UA: NEGATIVE
Blood, UA: NEGATIVE
Glucose, UA: NEGATIVE
Ketones, UA: NEGATIVE
Leukocytes, UA: NEGATIVE
Nitrite, UA: NEGATIVE
Protein, UA: NEGATIVE
Spec Grav, UA: 1.015
Urobilinogen, UA: 0.2
pH, UA: 6

## 2014-08-31 NOTE — Progress Notes (Signed)
Reviewed u/s findings: dications:Growth. Findings:  Singleton intrauterine pregnancy is visualized with FHR at 132 BPM. Biometrics give an (U/S) Gestational age of 16 1/7 weeks and an (U/S) EDD of 10-04-14; this correlates with the clinically established EDD of 10-11-14.  Fetal presentation is Vertex. Spine on maternal Lt. EFW: 5 lbs 13 oz = 76%. Placenta: posterior grade 1 AFI: 17.3 cm.    Impression: 1. 35 1/7 week Viable Singleton Intrauterine pregnancy by U/S. 2. (U/S) EDD is consistent with Clinically established (LMP) EDD of 10-11-14.  Will repeat growth scan at 38 weeks.

## 2014-08-31 NOTE — Progress Notes (Signed)
Pt c/o pressure in her head, denies any dizziness, heart pal

## 2014-09-02 ENCOUNTER — Encounter: Payer: Self-pay | Admitting: Dietician

## 2014-09-12 ENCOUNTER — Telehealth: Payer: Self-pay | Admitting: Obstetrics and Gynecology

## 2014-09-12 NOTE — Telephone Encounter (Signed)
PT CALLED AND WANTED YOU TO CALL HER BACK.

## 2014-09-13 NOTE — Telephone Encounter (Signed)
Spoke with pt discussed her issues

## 2014-09-14 ENCOUNTER — Encounter: Payer: BC Managed Care – PPO | Admitting: Obstetrics and Gynecology

## 2014-09-14 ENCOUNTER — Ambulatory Visit (INDEPENDENT_AMBULATORY_CARE_PROVIDER_SITE_OTHER): Payer: BC Managed Care – PPO | Admitting: Obstetrics and Gynecology

## 2014-09-14 ENCOUNTER — Encounter: Payer: Self-pay | Admitting: Obstetrics and Gynecology

## 2014-09-14 VITALS — BP 136/83 | HR 84 | Wt 150.1 lb

## 2014-09-14 DIAGNOSIS — Z36 Encounter for antenatal screening of mother: Secondary | ICD-10-CM

## 2014-09-14 DIAGNOSIS — Z3685 Encounter for antenatal screening for Streptococcus B: Secondary | ICD-10-CM

## 2014-09-14 DIAGNOSIS — Z3493 Encounter for supervision of normal pregnancy, unspecified, third trimester: Secondary | ICD-10-CM

## 2014-09-14 DIAGNOSIS — Z113 Encounter for screening for infections with a predominantly sexual mode of transmission: Secondary | ICD-10-CM

## 2014-09-14 LAB — POCT URINALYSIS DIPSTICK
Blood, UA: NEGATIVE
Glucose, UA: NEGATIVE
KETONES UA: NEGATIVE
LEUKOCYTES UA: NEGATIVE
Nitrite, UA: NEGATIVE
Spec Grav, UA: 1.025
Urobilinogen, UA: 0.2
pH, UA: 6

## 2014-09-14 NOTE — Progress Notes (Signed)
ROB- doing well, no changes in BS readings, cultures obtained; labor precautions discussed.

## 2014-09-14 NOTE — Progress Notes (Signed)
ROB-feet swelling, cultures today

## 2014-09-16 LAB — GC/CHLAMYDIA PROBE AMP
Chlamydia trachomatis, NAA: NEGATIVE
Neisseria gonorrhoeae by PCR: NEGATIVE

## 2014-09-16 LAB — STREP GP B NAA: STREP GROUP B AG: NEGATIVE

## 2014-09-22 ENCOUNTER — Ambulatory Visit (INDEPENDENT_AMBULATORY_CARE_PROVIDER_SITE_OTHER): Payer: BC Managed Care – PPO | Admitting: Obstetrics and Gynecology

## 2014-09-22 ENCOUNTER — Encounter: Payer: Self-pay | Admitting: Obstetrics and Gynecology

## 2014-09-22 VITALS — BP 130/76 | HR 79 | Wt 151.0 lb

## 2014-09-22 DIAGNOSIS — Z331 Pregnant state, incidental: Secondary | ICD-10-CM

## 2014-09-22 LAB — POCT URINALYSIS DIPSTICK
Blood, UA: NEGATIVE
Glucose, UA: NEGATIVE
Ketones, UA: NEGATIVE
LEUKOCYTES UA: NEGATIVE
NITRITE UA: NEGATIVE
Spec Grav, UA: 1.015
Urobilinogen, UA: 0.2
pH, UA: 6

## 2014-09-22 NOTE — Progress Notes (Signed)
ROB- doing well denies any PIH s/s, growth scan next visit

## 2014-09-22 NOTE — Progress Notes (Signed)
ROB-denies any changes 

## 2014-09-26 ENCOUNTER — Telehealth: Payer: Self-pay | Admitting: Obstetrics and Gynecology

## 2014-09-26 NOTE — Telephone Encounter (Signed)
PT CALLED AND STATED THAT HER BP WAS 148/90, SHE WAS AT THE ORTHO DOCTOR, SHE IS 38 WEEKS PREG WITH THE GESTATIONAL DIABETES, HER HEAD IS FEELING FUNNY BUT NOT HURTING, NOT SEEING STARS, NOT FEELING BAD AT ALL, BUT SHE WAS TOLD FROM THE ORTHO OFFICE THAT IS NOT HIGH BUT ITS A LITTLE RAISE AND SHE JUST WANTED TO LET us KNOW AND WOULD LIKE A CALL BACK TO MAKE SURE SHE IS OK.

## 2014-09-27 ENCOUNTER — Encounter: Payer: Self-pay | Admitting: Obstetrics and Gynecology

## 2014-09-27 ENCOUNTER — Ambulatory Visit (INDEPENDENT_AMBULATORY_CARE_PROVIDER_SITE_OTHER): Payer: BC Managed Care – PPO | Admitting: Obstetrics and Gynecology

## 2014-09-27 ENCOUNTER — Other Ambulatory Visit: Payer: Self-pay | Admitting: *Deleted

## 2014-09-27 VITALS — BP 137/84 | HR 87 | Wt 151.6 lb

## 2014-09-27 DIAGNOSIS — O133 Gestational [pregnancy-induced] hypertension without significant proteinuria, third trimester: Secondary | ICD-10-CM

## 2014-09-27 DIAGNOSIS — O163 Unspecified maternal hypertension, third trimester: Secondary | ICD-10-CM

## 2014-09-27 DIAGNOSIS — Z331 Pregnant state, incidental: Secondary | ICD-10-CM

## 2014-09-27 LAB — POCT URINALYSIS DIPSTICK
BILIRUBIN UA: NEGATIVE
Blood, UA: NEGATIVE
KETONES UA: NEGATIVE
Leukocytes, UA: NEGATIVE
Nitrite, UA: NEGATIVE
PH UA: 6
Spec Grav, UA: 1.01
Urobilinogen, UA: 0.2

## 2014-09-27 NOTE — Progress Notes (Signed)
OB work in- elevated BP, headache

## 2014-09-27 NOTE — Patient Instructions (Signed)

## 2014-09-27 NOTE — Telephone Encounter (Signed)
Called pt she is coming in @ 1:30

## 2014-09-27 NOTE — Progress Notes (Signed)
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  NST performed today was reviewed and was found to be reactive. Baseline120 with Moderate variability; No decels noted.  Continue recommended antenatal testing and prenatal care. Serial BPs were as follows when on NST and reclining: 133/86, 129/78, 122/77, 123/69  Note to excuse from work today and tomorrow given, to decrease activity and reviewed PIH s/s.

## 2014-09-28 ENCOUNTER — Other Ambulatory Visit: Payer: Self-pay | Admitting: Obstetrics and Gynecology

## 2014-09-28 ENCOUNTER — Ambulatory Visit: Payer: BC Managed Care – PPO

## 2014-09-28 ENCOUNTER — Encounter: Payer: Self-pay | Admitting: Obstetrics and Gynecology

## 2014-09-28 ENCOUNTER — Inpatient Hospital Stay
Admission: RE | Admit: 2014-09-28 | Discharge: 2014-10-01 | DRG: 774 | Disposition: A | Discharge status: Home / Self Care | Payer: BC Managed Care – PPO | Attending: Obstetrics and Gynecology | Admitting: Obstetrics and Gynecology

## 2014-09-28 ENCOUNTER — Ambulatory Visit (INDEPENDENT_AMBULATORY_CARE_PROVIDER_SITE_OTHER): Payer: BC Managed Care – PPO | Admitting: Obstetrics and Gynecology

## 2014-09-28 VITALS — BP 132/85 | HR 83 | Wt 150.0 lb

## 2014-09-28 DIAGNOSIS — O24429 Gestational diabetes mellitus in childbirth, unspecified control: Secondary | ICD-10-CM | POA: Diagnosis present

## 2014-09-28 DIAGNOSIS — Z3A38 38 weeks gestation of pregnancy: Secondary | ICD-10-CM | POA: Diagnosis present

## 2014-09-28 DIAGNOSIS — D509 Iron deficiency anemia, unspecified: Secondary | ICD-10-CM | POA: Diagnosis present

## 2014-09-28 DIAGNOSIS — D649 Anemia, unspecified: Secondary | ICD-10-CM | POA: Diagnosis not present

## 2014-09-28 DIAGNOSIS — O9081 Anemia of the puerperium: Secondary | ICD-10-CM | POA: Diagnosis not present

## 2014-09-28 DIAGNOSIS — Z331 Pregnant state, incidental: Secondary | ICD-10-CM

## 2014-09-28 DIAGNOSIS — O1493 Unspecified pre-eclampsia, third trimester: Secondary | ICD-10-CM | POA: Diagnosis present

## 2014-09-28 DIAGNOSIS — O1403 Mild to moderate pre-eclampsia, third trimester: Secondary | ICD-10-CM | POA: Diagnosis present

## 2014-09-28 DIAGNOSIS — Z3493 Encounter for supervision of normal pregnancy, unspecified, third trimester: Secondary | ICD-10-CM | POA: Diagnosis not present

## 2014-09-28 DIAGNOSIS — Z3490 Encounter for supervision of normal pregnancy, unspecified, unspecified trimester: Secondary | ICD-10-CM

## 2014-09-28 HISTORY — DX: Other specified health status: Z78.9

## 2014-09-28 LAB — COMPREHENSIVE METABOLIC PANEL
ALT: 17 U/L (ref 14–54)
ANION GAP: 10 (ref 5–15)
AST: 28 U/L (ref 15–41)
Albumin: 2.7 g/dL — ABNORMAL LOW (ref 3.5–5.0)
Alkaline Phosphatase: 129 U/L — ABNORMAL HIGH (ref 38–126)
BUN: 12 mg/dL (ref 6–20)
CHLORIDE: 106 mmol/L (ref 101–111)
CO2: 22 mmol/L (ref 22–32)
CREATININE: 0.87 mg/dL (ref 0.44–1.00)
Calcium: 9.2 mg/dL (ref 8.9–10.3)
Glucose, Bld: 87 mg/dL (ref 65–99)
Potassium: 3.6 mmol/L (ref 3.5–5.1)
SODIUM: 138 mmol/L (ref 135–145)
Total Bilirubin: 0.2 mg/dL — ABNORMAL LOW (ref 0.3–1.2)
Total Protein: 6.2 g/dL — ABNORMAL LOW (ref 6.5–8.1)

## 2014-09-28 LAB — BASIC METABOLIC PANEL
BUN / CREAT RATIO: 19 (ref 8–20)
BUN: 13 mg/dL (ref 6–20)
CALCIUM: 9.4 mg/dL (ref 8.7–10.2)
CHLORIDE: 101 mmol/L (ref 97–108)
CO2: 20 mmol/L (ref 18–29)
Creatinine, Ser: 0.68 mg/dL (ref 0.57–1.00)
GFR calc non Af Amer: 122 mL/min/{1.73_m2} (ref >59)
GFR, EST AFRICAN AMERICAN: 141 mL/min/{1.73_m2} (ref >59)
GLUCOSE: 82 mg/dL (ref 65–99)
POTASSIUM: 4.5 mmol/L (ref 3.5–5.2)
Sodium: 139 mmol/L (ref 134–144)

## 2014-09-28 LAB — POCT URINALYSIS DIPSTICK
BILIRUBIN UA: NEGATIVE
GLUCOSE UA: NEGATIVE
Ketones, UA: NEGATIVE
Leukocytes, UA: NEGATIVE
Nitrite, UA: NEGATIVE
RBC UA: NEGATIVE
SPEC GRAV UA: 1.015
Urobilinogen, UA: 0.2
pH, UA: 6.5

## 2014-09-28 LAB — CBC
HCT: 31.4 % — ABNORMAL LOW (ref 35.0–47.0)
Hematocrit: 32.4 % — ABNORMAL LOW (ref 34.0–46.6)
Hemoglobin: 10.8 g/dL — ABNORMAL LOW (ref 11.1–15.9)
Hemoglobin: 10.8 g/dL — ABNORMAL LOW (ref 12.0–16.0)
MCH: 32.6 pg (ref 26.6–33.0)
MCH: 33.7 pg (ref 26.0–34.0)
MCHC: 33.3 g/dL (ref 31.5–35.7)
MCHC: 34.5 g/dL (ref 32.0–36.0)
MCV: 98 fL — ABNORMAL HIGH (ref 79–97)
MCV: 97.7 fL (ref 80.0–100.0)
PLATELETS: 116 10*3/uL — AB (ref 150–379)
PLATELETS: 117 10*3/uL — AB (ref 150–440)
RBC: 3.31 x10E6/uL — AB (ref 3.77–5.28)
RBC: 3.22 MIL/uL — ABNORMAL LOW (ref 3.80–5.20)
RDW: 15.3 % (ref 12.3–15.4)
RDW: 14.6 % — AB (ref 11.5–14.5)
WBC: 4.2 10*3/uL (ref 3.4–10.8)
WBC: 5.5 10*3/uL (ref 3.6–11.0)

## 2014-09-28 LAB — PROTEIN / CREATININE RATIO, URINE
CREATININE, UR: 72.2 mg/dL
CREATININE, URINE: 197 mg/dL
PROTEIN CREATININE RATIO: 0.42 mg/mg{creat} — AB (ref 0.00–0.15)
PROTEIN UR: 27.3 mg/dL
PROTEIN/CREAT RATIO: 378 mg/g{creat} — AB (ref 0–200)
TOTAL PROTEIN, URINE: 82 mg/dL

## 2014-09-28 LAB — TYPE AND SCREEN
ABO/RH(D): O POS
ANTIBODY SCREEN: NEGATIVE

## 2014-09-28 LAB — URIC ACID
URIC ACID: 5.2 mg/dL (ref 2.5–7.1)
Uric Acid, Serum: 5.4 mg/dL (ref 2.3–6.6)

## 2014-09-28 LAB — OB RESULTS CONSOLE GBS: GBS: NEGATIVE

## 2014-09-28 LAB — AST: AST: 17 IU/L (ref 0–40)

## 2014-09-28 MED ORDER — OXYTOCIN BOLUS FROM INFUSION: 500.0000 mL | INTRAVENOUS | Status: DC

## 2014-09-28 MED ORDER — LACTATED RINGERS IV SOLN
INTRAVENOUS | Status: DC
  Administered 2014-09-28 – 2014-09-29 (×3): via INTRAVENOUS

## 2014-09-28 MED ORDER — LIDOCAINE HCL (PF) 1 % IJ SOLN: 30.0000 mL | INTRAMUSCULAR | Status: DC | PRN

## 2014-09-28 MED ORDER — LACTATED RINGERS IV SOLN: 500.0000 mL | INTRAVENOUS | Status: DC | PRN

## 2014-09-28 MED ORDER — ONDANSETRON HCL 4 MG/2ML IJ SOLN: 4.0000 mg | Freq: Four times a day (QID) | INTRAMUSCULAR | Status: DC | PRN

## 2014-09-28 MED ORDER — BUTORPHANOL TARTRATE 1 MG/ML IJ SOLN
1.0000 mg | INTRAMUSCULAR | Status: DC | PRN
  Administered 2014-09-29 (×4): 1 mg via INTRAVENOUS
  Filled 2014-09-28 (×4): qty 1

## 2014-09-28 MED ORDER — ACETAMINOPHEN 325 MG PO TABS: 650.0000 mg | ORAL_TABLET | ORAL | Status: DC | PRN

## 2014-09-28 MED ORDER — ZOLPIDEM TARTRATE 5 MG PO TABS
ORAL_TABLET | ORAL | Status: AC
  Administered 2014-09-28: 5 mg via ORAL
  Filled 2014-09-28: qty 1

## 2014-09-28 MED ORDER — OXYTOCIN 40 UNITS IN LACTATED RINGERS INFUSION - SIMPLE MED: 62.5000 mL/h | INTRAVENOUS | Status: DC

## 2014-09-28 MED ORDER — FENTANYL CITRATE (PF) 100 MCG/2ML IJ SOLN: 50.0000 ug | INTRAMUSCULAR | Status: DC | PRN

## 2014-09-28 MED ORDER — ZOLPIDEM TARTRATE 5 MG PO TABS
5.0000 mg | ORAL_TABLET | Freq: Once | ORAL | Status: AC
  Administered 2014-09-28: 5 mg via ORAL

## 2014-09-28 MED ORDER — CITRIC ACID-SODIUM CITRATE 334-500 MG/5ML PO SOLN: 30.0000 mL | ORAL | Status: DC | PRN

## 2014-09-28 MED ORDER — MISOPROSTOL 25 MCG QUARTER TABLET
25.0000 ug | ORAL_TABLET | ORAL | Status: DC | PRN
  Administered 2014-09-28 – 2014-09-29 (×3): 25 ug via VAGINAL
  Filled 2014-09-28 (×2): qty 1

## 2014-09-28 MED ORDER — MISOPROSTOL 25 MCG QUARTER TABLET
ORAL_TABLET | ORAL | Status: AC
  Administered 2014-09-28: 25 ug via VAGINAL
  Filled 2014-09-28: qty 0.25

## 2014-09-28 NOTE — Progress Notes (Signed)
ROB-pt was sweeping the floor today and became light-headed, denies any other changes

## 2014-09-28 NOTE — Progress Notes (Signed)
ROB & growth u/s; U/S reveals the following: Indications: Growth/AFI Findings:  Singleton intrauterine pregnancy is visualized with FHR at 131 BPM. Biometrics give an (U/S) Gestational age of [redacted] weeks and 4 days and an (U/S) EDD of 10/22/14; this correlates with the clinically established EDD of 10/11/14.  Fetal presentation is vertex, spine anterior.  EFW: 3035 grams ( 6 lbs. 11 oz. ) 29th percentile. Placenta: posterior, grade 2/3. AFI: 10.5 cm    Impression: 1. 36 week 4 day Viable Singleton Intrauterine pregnancy by U/S. 2. (U/S) EDD is consistent with Clinically established (LMP) EDD of 10/11/14.  Conseled on ultrasound and lab findings- IOL for PIH recommended- to Hospital for cytotec IOL and delivery.

## 2014-09-28 NOTE — H&P (Signed)
Darlene Leonard is a 24 y.o. female presenting for IOL secondary to pre-eclampsia and diet controlled GDM. Maternal Medical History:  Contractions: Frequency: rare.   Perceived severity is mild.    Fetal activity: Perceived fetal activity is decreased.   Last perceived fetal movement was within the past 24 hours.    Prenatal complications: Pre-eclampsia.   Prenatal Complications - Diabetes: gestational. Diabetes is managed by diet.      OB History    Gravida Para Term Preterm AB TAB SAB Ectopic Multiple Living   Obstetric Comments   D&C 06/20/2013     No past medical history on file. Past Surgical History  Procedure Laterality Date  . Appendectomy  2011   Family History: family history includes Breast cancer in her maternal aunt; Diabetes in her father and paternal uncle; Heart disease in her maternal grandmother, mother, and paternal grandmother. Social History:  reports that she quit smoking about 7 months ago. She has never used smokeless tobacco. She reports that she does not drink alcohol or use illicit drugs.   Prenatal Transfer Tool  Maternal Diabetes: Yes:  Diabetes Type:  Diet controlled Genetic Screening: Normal Maternal Ultrasounds/Referrals: Normal Fetal Ultrasounds or other Referrals:  None Maternal Substance Abuse:  No Significant Maternal Medications:  None Significant Maternal Lab Results:  Lab values include: Group B Strep negative Other Comments:  None  Review of Systems  Constitutional: Negative.   HENT: Negative.   Eyes: Negative.   Respiratory: Negative.   Cardiovascular: Negative.   Gastrointestinal: Negative.   Genitourinary: Positive for frequency.  Musculoskeletal: Negative.   Skin: Negative.   Neurological: Negative.   Endo/Heme/Allergies: Negative.   Psychiatric/Behavioral: Negative.       Last menstrual period 01/04/2014. Maternal Exam:  Uterine Assessment: Contraction frequency is rare.   Abdomen: Patient  reports no abdominal tenderness. Fundal height is 38cm.   Estimated fetal weight is 6#12oz.   Fetal presentation: vertex  Introitus: Normal vulva. Normal vagina.  Ferning test: not done.  Nitrazine test: not done.  Pelvis: adequate for delivery.   Cervix: Cervix evaluated by digital exam.     Physical Exam  Constitutional: She is oriented to person, place, and time. She appears well-developed and well-nourished.  Cardiovascular: Normal rate and regular rhythm.   GI: Soft.  Genitourinary: Vagina normal and uterus normal.  Musculoskeletal: Normal range of motion. She exhibits edema.  Neurological: She is alert and oriented to person, place, and time. She has normal reflexes.  Skin: Skin is warm and dry.  Psychiatric: She has a normal mood and affect. Her behavior is normal. Judgment and thought content normal.    Prenatal labs: ABO, Rh: O/Positive/-- (02/03 0000) Antibody: Negative (02/03 0000) Rubella: Immune (02/03 0000) RPR: Nonreactive (02/03 0000)  HBsAg: Negative (02/03 0000)  HIV: Non-reactive (02/03 0000)  GBS:     Assessment/Plan: [redacted]w[redacted]d EGA with diet controlled GDM and new onset pre-eclampsia with low platelet and protienuria- for cytotec IOL and PIH management.   Melody Burr 09/28/2014, 4:58 PM

## 2014-09-29 LAB — PLATELET COUNT
Platelets: 112 10*3/uL — ABNORMAL LOW (ref 150–440)
Platelets: 113 10*3/uL — ABNORMAL LOW (ref 150–440)

## 2014-09-29 LAB — ABO/RH: ABO/RH(D): O POS

## 2014-09-29 LAB — MAGNESIUM: Magnesium: 3.7 mg/dL — ABNORMAL HIGH (ref 1.7–2.4)

## 2014-09-29 MED ORDER — TERBUTALINE SULFATE 1 MG/ML IJ SOLN: 0.2500 mg | Freq: Once | INTRAMUSCULAR | Status: DC | PRN

## 2014-09-29 MED ORDER — IBUPROFEN 600 MG PO TABS
600.0000 mg | ORAL_TABLET | Freq: Four times a day (QID) | ORAL | Status: DC
  Administered 2014-09-29 – 2014-10-01 (×5): 600 mg via ORAL
  Filled 2014-09-29 (×4): qty 1

## 2014-09-29 MED ORDER — ONDANSETRON HCL 4 MG PO TABS: 4.0000 mg | ORAL_TABLET | ORAL | Status: DC | PRN

## 2014-09-29 MED ORDER — MISOPROSTOL 25 MCG QUARTER TABLET
50.0000 ug | ORAL_TABLET | Freq: Once | ORAL | Status: AC
  Administered 2014-09-29: 50 ug via VAGINAL
  Filled 2014-09-29: qty 1

## 2014-09-29 MED ORDER — FENTANYL 2.5 MCG/ML W/ROPIVACAINE 0.2% IN NS 100 ML EPIDURAL INFUSION (ARMC-ANES)
EPIDURAL | Status: AC
  Filled 2014-09-29: qty 100

## 2014-09-29 MED ORDER — ACETAMINOPHEN 325 MG PO TABS: 650.0000 mg | ORAL_TABLET | ORAL | Status: DC | PRN

## 2014-09-29 MED ORDER — OXYTOCIN 40 UNITS IN LACTATED RINGERS INFUSION - SIMPLE MED
INTRAVENOUS | Status: AC
  Administered 2014-09-29: 1 m[IU]/min via INTRAVENOUS
  Filled 2014-09-29: qty 1000

## 2014-09-29 MED ORDER — LABETALOL HCL 5 MG/ML IV SOLN: 20.0000 mg | INTRAVENOUS | Status: DC | PRN

## 2014-09-29 MED ORDER — OXYCODONE-ACETAMINOPHEN 5-325 MG PO TABS: 2.0000 | ORAL_TABLET | ORAL | Status: DC | PRN

## 2014-09-29 MED ORDER — DIPHENHYDRAMINE HCL 25 MG PO CAPS: 25.0000 mg | ORAL_CAPSULE | Freq: Four times a day (QID) | ORAL | Status: DC | PRN

## 2014-09-29 MED ORDER — OXYTOCIN 10 UNIT/ML IJ SOLN
INTRAMUSCULAR | Status: AC
  Filled 2014-09-29: qty 2

## 2014-09-29 MED ORDER — CALCIUM GLUCONATE 10 % IV SOLN
INTRAVENOUS | Status: AC
  Filled 2014-09-29: qty 10

## 2014-09-29 MED ORDER — OXYTOCIN 40 UNITS IN LACTATED RINGERS INFUSION - SIMPLE MED
1.0000 m[IU]/min | INTRAVENOUS | Status: DC
  Administered 2014-09-29: 3 m[IU]/min via INTRAVENOUS
  Administered 2014-09-29: 7 m[IU]/min via INTRAVENOUS
  Administered 2014-09-29: 1 m[IU]/min via INTRAVENOUS
  Administered 2014-09-29: 5 m[IU]/min via INTRAVENOUS

## 2014-09-29 MED ORDER — SODIUM CHLORIDE 0.9 % IJ SOLN
INTRAMUSCULAR | Status: AC
  Filled 2014-09-29: qty 50

## 2014-09-29 MED ORDER — MAGNESIUM SULFATE 4 GM/100ML IV SOLN
INTRAVENOUS | Status: AC
  Administered 2014-09-29: 4 g
  Filled 2014-09-29: qty 100

## 2014-09-29 MED ORDER — SIMETHICONE 80 MG PO CHEW: 80.0000 mg | CHEWABLE_TABLET | ORAL | Status: DC | PRN

## 2014-09-29 MED ORDER — MAGNESIUM SULFATE 50 % IJ SOLN
1.0000 g/h | INTRAVENOUS | Status: DC
  Administered 2014-09-29: 1 g/h via INTRAVENOUS
  Filled 2014-09-29 (×2): qty 80

## 2014-09-29 MED ORDER — LIDOCAINE HCL (PF) 1 % IJ SOLN
INTRAMUSCULAR | Status: AC
  Filled 2014-09-29: qty 30

## 2014-09-29 MED ORDER — DIBUCAINE 1 % RE OINT: 1.0000 "application " | TOPICAL_OINTMENT | RECTAL | Status: DC | PRN

## 2014-09-29 MED ORDER — OXYCODONE-ACETAMINOPHEN 5-325 MG PO TABS
1.0000 | ORAL_TABLET | ORAL | Status: DC | PRN
  Administered 2014-09-30 (×3): 1 via ORAL
  Filled 2014-09-29 (×3): qty 1

## 2014-09-29 MED ORDER — MAGNESIUM SULFATE BOLUS VIA INFUSION
4.0000 g | Freq: Once | INTRAVENOUS | Status: DC
  Filled 2014-09-29: qty 500

## 2014-09-29 MED ORDER — FUSION PLUS PO CAPS: 1.0000 | ORAL_CAPSULE | Freq: Every day | ORAL | Status: DC

## 2014-09-29 MED ORDER — BENZOCAINE-MENTHOL 20-0.5 % EX AERO: 1.0000 "application " | INHALATION_SPRAY | CUTANEOUS | Status: DC | PRN

## 2014-09-29 MED ORDER — WITCH HAZEL-GLYCERIN EX PADS
1.0000 "application " | MEDICATED_PAD | CUTANEOUS | Status: DC | PRN
  Administered 2014-10-01: 1 via TOPICAL
  Filled 2014-09-29: qty 100

## 2014-09-29 MED ORDER — OXYTOCIN 40 UNITS IN LACTATED RINGERS INFUSION - SIMPLE MED: 999.0000 mL/h | INTRAVENOUS | Status: DC

## 2014-09-29 MED ORDER — LANOLIN HYDROUS EX OINT: TOPICAL_OINTMENT | CUTANEOUS | Status: DC | PRN

## 2014-09-29 MED ORDER — MISOPROSTOL 200 MCG PO TABS
ORAL_TABLET | ORAL | Status: AC
  Filled 2014-09-29: qty 4

## 2014-09-29 MED ORDER — HYDRALAZINE HCL 20 MG/ML IJ SOLN: 10.0000 mg | Freq: Once | INTRAMUSCULAR | Status: DC | PRN

## 2014-09-29 MED ORDER — SENNOSIDES-DOCUSATE SODIUM 8.6-50 MG PO TABS: 2.0000 | ORAL_TABLET | ORAL | Status: DC

## 2014-09-29 MED ORDER — AMMONIA AROMATIC IN INHA
RESPIRATORY_TRACT | Status: AC
  Filled 2014-09-29: qty 10

## 2014-09-29 MED ORDER — ONDANSETRON HCL 4 MG/2ML IJ SOLN: 4.0000 mg | INTRAMUSCULAR | Status: DC | PRN

## 2014-09-29 MED ORDER — PRENATAL MULTIVITAMIN CH
1.0000 | ORAL_TABLET | Freq: Every day | ORAL | Status: DC
  Administered 2014-09-30 – 2014-10-01 (×2): 1 via ORAL
  Filled 2014-09-29 (×4): qty 1

## 2014-09-29 MED ORDER — IBUPROFEN 600 MG PO TABS
ORAL_TABLET | ORAL | Status: AC
  Administered 2014-09-29: 600 mg via ORAL
  Filled 2014-09-29: qty 1

## 2014-09-29 MED ORDER — OXYTOCIN 40 UNITS IN LACTATED RINGERS INFUSION - SIMPLE MED: 62.5000 mL/h | INTRAVENOUS | Status: DC | PRN

## 2014-09-29 NOTE — Progress Notes (Signed)
TYTEANNA OST is a 24 y.o. G2P0010 at [redacted]w[redacted]d by LMP admitted for induction of labor due to Pre-eclamptic toxemia of pregnancy..  Subjective: Feeling 'crampy' but able to sleep at times.  Objective: BP 150/98 mmHg  Pulse 85  Temp(Src) 97.8 F (36.6 C) (Oral)  Resp 18  Ht  (1.6 m)  Wt 68.04 kg (150 lb)  BMI 26.58 kg/m2  LMP 01/04/2014 I/O last 3 completed shifts: In: 562.5 [I.V.:562.5] Out: -  Total I/O In: 125.5 [I.V.:125.5] Out: -   Fetal Wellbeing:  Category III UC:   irregular, every 2-5 minutes, mild to plapation SVE:   Dilation: 1 Effacement (%): 50 Station: -1 Exam by:: chg rnc  Labs: Lab Results  Component Value Date   WBC 5.5 09/28/2014   HGB 10.8* 09/28/2014   HCT 31.4* 09/28/2014   MCV 97.7 09/28/2014   PLT 117* 09/28/2014    Assessment / Plan: Induction of labor due to preeclampsia,  progressing well on pitocin  Labor: changing to Pitocin per protocol and foley bulb placed in cervix Preeclampsia:  on magnesium sulfate and intake and ouput balanced Pain Control:  Labor support without medications Anticipated MOD:  NSVD  Melody Burr 09/29/2014, 1:37 PM

## 2014-09-29 NOTE — Progress Notes (Signed)
Darlene Leonard is a 24 y.o. G2P0010 at [redacted]w[redacted]d by LMP admitted for induction of labor due to Pre-eclamptic toxemia of pregnancy..  Subjective:   Objective: BP 148/96 mmHg  Pulse 97  Temp(Src) 97.9 F (36.6 C) (Oral)  Resp 18  Ht  (1.6 m)  Wt 68.04 kg (150 lb)  BMI 26.58 kg/m2  LMP 01/04/2014 I/O last 3 completed shifts: In: 562.5 [I.V.:562.5] Out: -  Total I/O In: 225.5 [I.V.:225.5] Out: 525 [Urine:525]  Fetal Wellbeing:  Category III UC:   regular, every 2-4 minutes, on 3 mu/min of pitocin SVE:   5/75/-2 with increased bloody show Labs: Lab Results  Component Value Date   WBC 5.5 09/28/2014   HGB 10.8* 09/28/2014   HCT 31.4* 09/28/2014   MCV 97.7 09/28/2014   PLT 112* 09/29/2014    Assessment / Plan: Augmentation of labor, progressing well  Labor: Pitocin augmentation, foley bulb expulsed, AROM by meslf with copious amount clear fluid Preeclampsia:  on magnesium sulfate, no signs or symptoms of toxicity, intake and ouput balanced and labs stable Pain Control:  Labor support without medications Anticipated MOD:  NSVD  Darlene Leonard 09/29/2014, 4:51 PM

## 2014-09-29 NOTE — H&P (Signed)
Darlene Leonard is a 24 y.o. G2P0010 at [redacted]w[redacted]d by LMP admitted for induction of labor due to Pre-eclamptic toxemia of pregnancy..  Subjective:   Objective: BP 132/89 mmHg  Pulse 88  Temp(Src) 98.5 F (36.9 C) (Oral)  Resp 18  Ht  (1.6 m)  Wt 68.04 kg (150 lb)  BMI 26.58 kg/m2  LMP 01/04/2014 I/O last 3 completed shifts: In: 562.5 [I.V.:562.5] Out: -     Fetal Wellbeing:  Category III UC:   irregular, every 2-5 minutes, mild to palpation and patient was able to sleep through them SVE:   Dilation: 1 Effacement (%): 20, 30 Exam by:: JLW  Labs: Lab Results  Component Value Date   WBC 5.5 09/28/2014   HGB 10.8* 09/28/2014   HCT 31.4* 09/28/2014   MCV 97.7 09/28/2014   PLT 117* 09/28/2014    Assessment / Plan: Induction of labor due to preeclampsia,  progressing well on pitocin  Labor: cervical ripening occuring as expected, will place one more dose cytotec , then switch to pitocin Preeclampsia:  no signs or symptoms of toxicity, intake and ouput balanced and will start magnesium this am Pain Control:  stadol given this am Anticipated MOD:  NSVD  Darlene Leonard 09/29/2014, 7:57 AM

## 2014-09-30 LAB — CBC
HEMATOCRIT: 28.5 % — AB (ref 35.0–47.0)
Hemoglobin: 9.7 g/dL — ABNORMAL LOW (ref 12.0–16.0)
MCH: 33.3 pg (ref 26.0–34.0)
MCHC: 34.2 g/dL (ref 32.0–36.0)
MCV: 97.4 fL (ref 80.0–100.0)
Platelets: 95 10*3/uL — ABNORMAL LOW (ref 150–440)
RBC: 2.92 MIL/uL — ABNORMAL LOW (ref 3.80–5.20)
RDW: 14.8 % — AB (ref 11.5–14.5)
WBC: 9.6 10*3/uL (ref 3.6–11.0)

## 2014-09-30 LAB — RPR: RPR: NONREACTIVE

## 2014-09-30 MED ORDER — FERROUS FUMARATE 325 (106 FE) MG PO TABS
1.0000 | ORAL_TABLET | Freq: Two times a day (BID) | ORAL | Status: DC
  Filled 2014-09-30 (×2): qty 1

## 2014-09-30 MED ORDER — FERROUS SULFATE 325 (65 FE) MG PO TABS
ORAL_TABLET | ORAL | Status: AC
  Administered 2014-09-30: 325 mg
  Filled 2014-09-30: qty 1

## 2014-09-30 MED ORDER — LACTATED RINGERS IV SOLN
1000.0000 mL | INTRAVENOUS | Status: DC
  Administered 2014-09-30: 1000 mL via INTRAVENOUS

## 2014-09-30 MED ORDER — FERROUS SULFATE 325 (65 FE) MG PO TABS
325.0000 mg | ORAL_TABLET | Freq: Two times a day (BID) | ORAL | Status: DC
  Administered 2014-09-30 – 2014-10-01 (×2): 325 mg via ORAL
  Filled 2014-09-30 (×2): qty 1

## 2014-09-30 NOTE — Lactation Note (Signed)
This note was copied from the chart of Darlene Leonard. Lactation Consultation Note  Patient Name: Darlene Leonard [id number] Date: 09/30/2014 Reason for consult: Initial assessment;Difficult latch Baby is mucousy and gags, sucks on tongue, no mec yet, will latch to breast with nipple shield and suck with stimulation, holds latch but sleepy and not very interested in sucking, nursed better on left breast  Maternal Data Does the patient have breastfeeding experience prior to this delivery?: No Mom has firm, swollen areolas and baby unable to latch deeply, pumping attempted to elongate nipple and soften areola, use of 20mm nipple shield begun Feeding Feeding Type: Breast Milk Length of feed: 10 min (each breast attempted, few sucks on right, nursed fair on le)  LATCH Score/Interventions Latch: Repeated attempts needed to sustain latch, nipple held in mouth throughout feeding, stimulation needed to elicit sucking reflex. Intervention(s): Adjust position;Assist with latch;Breast massage;Breast compression  Audible Swallowing: None Intervention(s): Skin to skin;Hand expression  Type of Nipple: Everted at rest and after stimulation (areola firm and difficult for baby to latch without shield)  Comfort (Breast/Nipple): Soft / non-tender     Hold (Positioning): Full assist, staff holds infant at breast Intervention(s): Breastfeeding basics reviewed;Support Pillows;Position options  LATCH Score: 5  Lactation Tools Discussed/Used Tools: Nipple Dorris Carnes;Pump WIC Program: No Pump Review: Setup, frequency, and cleaning Initiated by:: Clair Gulling  RNC, IBCLC Date initiated:: 09/30/14   Consult Status Consult Status: Follow-up Date: 09/30/14    Dyann Kief 09/30/2014, 1:39 PM

## 2014-09-30 NOTE — Lactation Note (Signed)
This note was copied from the chart of Darlene Leonard. Lactation Consultation Note  Patient Name: Darlene Leonard [id number] Date: 09/30/2014 Reason for consult: Difficult latch (pt desires to bottle feed and pump breasts to feed breastmil)   Maternal Data   Mom desires to bottle feed baby instead of breastfeeding , she also wants to pump her breasts to stimulate breastmilk to give her baby, encourage her to pump breasts every 3hrs to make milk, she plans on obtaining a breast pump through her insurance, encouraged her to call insurance asap to make arrangements for pump for home use.  Feeding Feeding Type: Formula Nipple Type: Slow - flow  LATCH Score/Interventions                      Lactation Tools Discussed/Used Breast pump type: Double-Electric Breast Pump   Consult Status      Dyann Kief 09/30/2014, 6:21 PM

## 2014-09-30 NOTE — Progress Notes (Signed)
Post Partum Day 1 S/P SVD after IOL for Mild Pre E. Subjective: tolerating PO and noting mild cramps. No PIH sxs.  Objective: Blood pressure 129/83, pulse 84, temperature 97.6 F (36.4 C), temperature source Oral, resp. rate 18, height  (1.6 m), weight 150 lb (68.04 kg), last menstrual period 01/04/2014.  Physical Exam:  BP 129/83 mmHg  Pulse 84  Temp(Src) 97.6 F (36.4 C) (Oral)  Resp 18  Ht  (1.6 m)  Wt 150 lb (68.04 kg)  BMI 26.58 kg/m2  LMP 01/04/2014  General: alert and cooperative Lochia: appropriate Uterine Fundus: firm Incision: NA DVT Evaluation: No evidence of DVT seen on physical exam. DTR's 2/4 without clonus Total I/O In: 75 [I.V.:75] Out: 200 [Urine:200]  CBC Latest Ref Rng 09/30/2014 09/29/2014 09/29/2014  WBC 3.6 - 11.0 K/uL 9.6 - -  Hemoglobin 12.0 - 16.0 g/dL 1.6(X) - -  Hematocrit 35.0 - 47.0 % 28.5(L) - -  Platelets 150 - 440 K/uL 95(L) 113(L) 112(L)    Assessment/Plan: 1. Stable 2. Anemia, asymptomtic 3. Platelets 95K  Continue Magnesium x 24 hrs postpartum Iron bid Discharge tomorrow    LOS: 2 days   Prentice Docker Shaquille Murdy 09/30/2014, 10:32 AM

## 2014-10-01 DIAGNOSIS — D509 Iron deficiency anemia, unspecified: Secondary | ICD-10-CM | POA: Diagnosis present

## 2014-10-01 MED ORDER — IBUPROFEN 600 MG PO TABS: 600.0000 mg | ORAL_TABLET | Freq: Four times a day (QID) | ORAL | Status: DC

## 2014-10-01 MED ORDER — FERROUS SULFATE 325 (65 FE) MG PO TABS: 325.0000 mg | ORAL_TABLET | Freq: Two times a day (BID) | ORAL | Status: DC

## 2014-10-01 MED ORDER — OXYCODONE-ACETAMINOPHEN 5-325 MG PO TABS: 2.0000 | ORAL_TABLET | ORAL | Status: DC | PRN

## 2014-10-01 NOTE — Progress Notes (Signed)
Patient discharged home with infant. Discharge instructions, prescriptions and follow up appointment given to and reviewed with patient. Patient verbalized understanding. Escorted out by San Jetty, RN.

## 2014-10-01 NOTE — Discharge Summary (Signed)
Obstetric Discharge Summary Reason for Admission: induction of labor and Mild Pre Eclampsia Prenatal Procedures: NST, Preeclampsia and ultrasound Intrapartum Procedures: Magnesium Sulfate prophylaxis Postpartum Procedures: Magnesium Sulfate prophylaxis Complications-Operative and Postpartum: none HEMOGLOBIN  Date Value Ref Range Status  09/30/2014 9.7* 12.0 - 16.0 g/dL Final  16/10/9602 54.0 g/dL Final   HCT  Date Value Ref Range Status  09/30/2014 28.5* 35.0 - 47.0 % Final  03/02/2014 37 % Final   HEMATOCRIT  Date Value Ref Range Status  09/27/2014 32.4* 34.0 - 46.6 % Final    Physical Exam:  General: alert and cooperative Lochia: appropriate Uterine Fundus: firm Incision: NA DVT Evaluation: No evidence of DVT seen on physical exam.  Discharge Diagnoses: Term Pregnancy-delivered, Preelampsia and GDM  Discharge Information: Date: 10/01/2014 Activity: pelvic rest Diet: routine Medications: PNV, Ibuprofen, Colace, Iron and Percocet Condition: improved Instructions: refer to practice specific booklet Discharge to: home Follow-up Information    Follow up with Yolanda Bonine, CNM In 1 week.   Specialties:  Obstetrics and Gynecology, Radiology   Why:  BP check (Pre Eclampsia)   Contact information:   [address] [place] [address] [phone]       Newborn Data: Live born female  Birth Weight: 6 lb 2.4 oz (2790 g) APGAR: 8, 9  Home with mother  Breastfeeding.  Daphine Deutscher A Ladonna Vanorder 10/01/2014, 9:02 AM

## 2014-10-01 NOTE — Discharge Instructions (Signed)
Iron Deficiency Anemia °Anemia is when you have a low number of healthy red blood cells. It is often caused by too little iron. This is called iron deficiency anemia. It may make you tired and short of breath. °HOME CARE  °· Take iron as told by your doctor. °· Take vitamins as told by your doctor. °· Eat foods that have iron in them. This includes liver, lean beef, whole-grain bread, eggs, dried fruit, and dark green leafy vegetables. °GET HELP RIGHT AWAY IF: °· You pass out (faint). °· You have chest pain. °· You feel sick to your stomach (nauseous) or throw up (vomit). °· You get very short of breath with activity. °· You are weak. °· You have a fast heartbeat. °· You start to sweat for no reason. °· You become light-headed when getting up from a chair or bed. °MAKE SURE YOU: °· Understand these instructions. °· Will watch your condition. °· Will get help right away if you are not doing well or get worse. °Document Released: 02/16/2010 Document Revised: 01/19/2013 Document Reviewed: 09/21/2012 °[employer]® Patient Information ©2015 [employer]. This information is not intended to replace advice given to you by your health care provider. Make sure you discuss any questions you have with your health care provider. ° °

## 2014-10-07 ENCOUNTER — Encounter: Payer: Self-pay | Admitting: Obstetrics and Gynecology

## 2014-10-07 ENCOUNTER — Ambulatory Visit (INDEPENDENT_AMBULATORY_CARE_PROVIDER_SITE_OTHER): Payer: BC Managed Care – PPO | Admitting: Obstetrics and Gynecology

## 2014-10-07 VITALS — BP 135/93 | HR 98 | Wt 130.4 lb

## 2014-10-07 DIAGNOSIS — O169 Unspecified maternal hypertension, unspecified trimester: Secondary | ICD-10-CM

## 2014-10-07 DIAGNOSIS — Z331 Pregnant state, incidental: Secondary | ICD-10-CM

## 2014-10-07 DIAGNOSIS — Z36 Encounter for antenatal screening of mother: Secondary | ICD-10-CM

## 2014-10-07 DIAGNOSIS — Z369 Encounter for antenatal screening, unspecified: Secondary | ICD-10-CM

## 2014-10-07 DIAGNOSIS — O165 Unspecified maternal hypertension, complicating the puerperium: Secondary | ICD-10-CM

## 2014-10-07 LAB — POCT URINALYSIS DIPSTICK
BILIRUBIN UA: NEGATIVE
Glucose, UA: NEGATIVE
KETONES UA: NEGATIVE
Nitrite, UA: NEGATIVE
PH UA: 6
Protein, UA: NEGATIVE
Spec Grav, UA: 1.02
Urobilinogen, UA: NEGATIVE

## 2014-10-07 MED ORDER — METHYLDOPA 500 MG PO TABS: 500.0000 mg | ORAL_TABLET | Freq: Two times a day (BID) | ORAL | Status: DC

## 2014-10-07 MED ORDER — INFLUENZA VAC SPLIT QUAD 0.5 ML IM SUSY
0.5000 mL | PREFILLED_SYRINGE | Freq: Once | INTRAMUSCULAR | Status: AC
  Administered 2014-10-07: 0.5 mL via INTRAMUSCULAR

## 2014-10-07 NOTE — Progress Notes (Signed)
Patient ID: Darlene Leonard, female   DOB: 1990/06/18, 24 y.o.   MRN: [id number]  Here for BP check due to pre-eclampsia in pregnancy , now 1 week PP without complaint  BP 135/93  A: PP HTN  P: Methyldopa  bid Stop by 1 week to check BP  Yolanda Bonine, CNM

## 2014-10-14 ENCOUNTER — Ambulatory Visit (INDEPENDENT_AMBULATORY_CARE_PROVIDER_SITE_OTHER): Payer: BC Managed Care – PPO | Admitting: Obstetrics and Gynecology

## 2014-10-14 VITALS — BP 122/80 | HR 67 | Wt 130.1 lb

## 2014-10-14 DIAGNOSIS — O169 Unspecified maternal hypertension, unspecified trimester: Secondary | ICD-10-CM

## 2014-10-14 DIAGNOSIS — O165 Unspecified maternal hypertension, complicating the puerperium: Secondary | ICD-10-CM

## 2014-10-14 NOTE — Progress Notes (Cosign Needed)
Patient ID: Darlene Leonard, female   DOB: [birth year], 24 y.o.   MRN: [id number] Pt is here for BP follow-up, pt is doing well Denies any side effects, states she did forget to take medication 10/13/14 Advised pt we would let her know the plan for follow up, she voiced understanding

## 2014-11-11 ENCOUNTER — Encounter: Payer: Self-pay | Admitting: Obstetrics and Gynecology

## 2014-11-11 ENCOUNTER — Ambulatory Visit (INDEPENDENT_AMBULATORY_CARE_PROVIDER_SITE_OTHER): Payer: BC Managed Care – PPO | Admitting: Obstetrics and Gynecology

## 2014-11-11 MED ORDER — DESOGESTREL-ETHINYL ESTRADIOL 0.15-0.02/0.01 MG (21/5) PO TABS: 1.0000 | ORAL_TABLET | Freq: Every day | ORAL | Status: DC

## 2014-11-11 NOTE — Progress Notes (Signed)
  Subjective:     Darlene Leonard is a 24 y.o. female who presents for a postpartum visit. She is 6 weeks postpartum following a spontaneous vaginal delivery. I have fully reviewed the prenatal and intrapartum course. The delivery was at 39 gestational weeks. Outcome: spontaneous vaginal delivery. Anesthesia: epidural. Postpartum course has been complicated by PIH. Baby's course has been uneventful. Baby is feeding by formula. Bleeding started menses today. Bowel function is normal. Bladder function is normal. Patient is not sexually active. Contraception method is abstinence. Postpartum depression screening: negative.  The following portions of the patient's history were reviewed and updated as appropriate: allergies, current medications, past family history, past medical history, past social history, past surgical history and problem list.  Review of Systems A comprehensive review of systems was negative.   Objective:    BP 132/89 mmHg  Pulse 98  Ht 5\' 3"  (1.6 m)  Wt 130 lb 6.4 oz (59.149 kg)  BMI 23.11 kg/m2  LMP 11/11/2014  Breastfeeding? No  General:  alert, cooperative and appears stated age   Breasts:  inspection negative, no nipple discharge or bleeding, no masses or nodularity palpable  Lungs: clear to auscultation bilaterally  Heart:  regular rate and rhythm, S1, S2 normal, no murmur, click, rub or gallop  Abdomen: soft, non-tender; bowel sounds normal; no masses,  no organomegaly   Vulva:  normal  Vagina: normal vagina  Cervix:  multiparous appearance  Corpus: normal size, contour, position, consistency, mobility, non-tender  Adnexa:  normal adnexa and no mass, fullness, tenderness  Rectal Exam: Not performed.        Assessment:     6 weeks postpartum exam. Pap smear not done at today's visit.   Plan:    1. Contraception: OCP (estrogen/progesterone) 2. PIH resolved 3. Follow up in: 4 months for AE or as needed.

## 2014-11-11 NOTE — Patient Instructions (Signed)
  Place postpartum visit patient instructions here.  

## 2014-12-19 ENCOUNTER — Telehealth: Payer: Self-pay | Admitting: Obstetrics and Gynecology

## 2014-12-19 NOTE — Telephone Encounter (Signed)
Pt states she is pregnant but her insurance will not Systems developercove Diclegis. Advised she could try unisom 12.5mg  nightly and vitaimn B6, 3xd  Which are the ingredients in Diclegis. Pt appreciative of call.

## 2014-12-19 NOTE — Telephone Encounter (Signed)
LMTCO.

## 2014-12-19 NOTE — Telephone Encounter (Signed)
Pt called and normally amy give her some sample of diclegis due to her insurance not covering it and its like 600.00, so she wanted to know if she could get a couple of samples of diclegis.

## 2014-12-20 ENCOUNTER — Ambulatory Visit (INDEPENDENT_AMBULATORY_CARE_PROVIDER_SITE_OTHER): Payer: BC Managed Care – PPO | Admitting: Obstetrics and Gynecology

## 2014-12-20 ENCOUNTER — Ambulatory Visit: Payer: BC Managed Care – PPO

## 2014-12-20 ENCOUNTER — Other Ambulatory Visit: Payer: Self-pay

## 2014-12-20 DIAGNOSIS — Z3200 Encounter for pregnancy test, result unknown: Secondary | ICD-10-CM

## 2014-12-20 DIAGNOSIS — Z3490 Encounter for supervision of normal pregnancy, unspecified, unspecified trimester: Secondary | ICD-10-CM

## 2014-12-20 DIAGNOSIS — Z3493 Encounter for supervision of normal pregnancy, unspecified, third trimester: Secondary | ICD-10-CM

## 2014-12-20 DIAGNOSIS — Z331 Pregnant state, incidental: Secondary | ICD-10-CM

## 2014-12-20 DIAGNOSIS — Z3201 Encounter for pregnancy test, result positive: Secondary | ICD-10-CM

## 2014-12-20 NOTE — Addendum Note (Signed)
Addended by: Davene CostainGRAVES, Janara Klett C on: 12/20/2014 03:36 PM   Modules accepted: Orders

## 2014-12-21 LAB — BETA HCG QUANT (REF LAB): hCG Quant: 13397 m[IU]/mL

## 2014-12-27 ENCOUNTER — Other Ambulatory Visit: Payer: Self-pay | Admitting: Obstetrics and Gynecology

## 2014-12-27 ENCOUNTER — Telehealth: Payer: Self-pay | Admitting: Obstetrics and Gynecology

## 2014-12-27 DIAGNOSIS — O209 Hemorrhage in early pregnancy, unspecified: Secondary | ICD-10-CM

## 2014-12-27 NOTE — Telephone Encounter (Signed)
Notified pt she is coming in 12/28/14

## 2014-12-27 NOTE — Telephone Encounter (Signed)
i put in order for a quant. Have her come in tomorrow to have it draw, Most likely still implantation bleeding.

## 2014-12-27 NOTE — Telephone Encounter (Signed)
Pt is having some bleeding not a lot just when she wipes she is concerned, should we do another beta on pt???

## 2014-12-27 NOTE — Telephone Encounter (Signed)
Patient called stating she is still bleeding. She is [redacted] weeks pregnant. You can reach her at [phone]. Thanks

## 2014-12-28 ENCOUNTER — Other Ambulatory Visit: Payer: BC Managed Care – PPO

## 2014-12-28 ENCOUNTER — Other Ambulatory Visit: Payer: Self-pay | Admitting: *Deleted

## 2014-12-28 DIAGNOSIS — O209 Hemorrhage in early pregnancy, unspecified: Secondary | ICD-10-CM

## 2014-12-28 DIAGNOSIS — N926 Irregular menstruation, unspecified: Secondary | ICD-10-CM

## 2014-12-30 ENCOUNTER — Ambulatory Visit: Payer: BC Managed Care – PPO | Admitting: Obstetrics and Gynecology

## 2014-12-30 LAB — BETA HCG QUANT (REF LAB): hCG Quant: 54595 m[IU]/mL

## 2015-01-03 ENCOUNTER — Other Ambulatory Visit: Payer: Self-pay | Admitting: Obstetrics and Gynecology

## 2015-01-03 DIAGNOSIS — O3663X2 Maternal care for excessive fetal growth, third trimester, fetus 2: Secondary | ICD-10-CM

## 2015-01-06 ENCOUNTER — Ambulatory Visit (INDEPENDENT_AMBULATORY_CARE_PROVIDER_SITE_OTHER): Payer: BC Managed Care – PPO

## 2015-01-06 ENCOUNTER — Encounter: Payer: Self-pay | Admitting: Obstetrics and Gynecology

## 2015-01-06 ENCOUNTER — Ambulatory Visit (INDEPENDENT_AMBULATORY_CARE_PROVIDER_SITE_OTHER): Payer: BC Managed Care – PPO | Admitting: Obstetrics and Gynecology

## 2015-01-06 VITALS — BP 143/78 | HR 90 | Wt 134.1 lb

## 2015-01-06 DIAGNOSIS — Z3491 Encounter for supervision of normal pregnancy, unspecified, first trimester: Secondary | ICD-10-CM

## 2015-01-06 DIAGNOSIS — O3663X2 Maternal care for excessive fetal growth, third trimester, fetus 2: Secondary | ICD-10-CM | POA: Diagnosis not present

## 2015-01-06 MED ORDER — ASPIRIN 81 MG PO TABS
81.0000 mg | ORAL_TABLET | Freq: Every day | ORAL | Status: DC
Start: 2015-01-06 — End: 2015-08-11

## 2015-01-06 MED ORDER — FOLIC ACID 1 MG PO TABS: 1.0000 mg | ORAL_TABLET | Freq: Every day | ORAL | Status: DC

## 2015-01-06 MED ORDER — METHYLDOPA 500 MG PO TABS: 500.0000 mg | ORAL_TABLET | Freq: Two times a day (BID) | ORAL | Status: DC

## 2015-01-06 NOTE — Progress Notes (Signed)
NOB nurse intake, pt had US dating/viabity done, all info given to pt

## 2015-01-06 NOTE — Progress Notes (Signed)
Indications: Dating/Viability Findings:  Singleton intrauterine pregnancy is visualized with a CRL consistent with 7 weeks and 4 days gestation, giving an (U/S) EDD of 08/21/15.   FHR: 155 bpm CRL measurement: 13.0 mm Yolk sac and and early anatomy is normal.  Right Ovary measures 3.0 x 2.2 x 2.3 cm. It is normal in appearance. Left Ovary measures 2.7 x 2.3 x 1.7 cm. It is normal appearance. There is evidence of a corpus luteal cyst in the right ovary measuring 1.9 x 1.9 x 1.9 cm. Survey of the adnexa demonstrates no adnexal masses. There is no free peritoneal fluid in the cul de sac.  Impression: 1. 7 week 4 day Viable Singleton Intrauterine pregnancy by U/S.

## 2015-01-07 LAB — VARICELLA ZOSTER ANTIBODY, IGG: Varicella zoster IgG: 428 index (ref >165)

## 2015-01-07 LAB — HEP, RPR, HIV PANEL
HEP B S AG: NEGATIVE
HIV SCREEN 4TH GENERATION: NONREACTIVE
RPR: NONREACTIVE

## 2015-01-07 LAB — CBC WITH DIFFERENTIAL/PLATELET
BASOS ABS: 0 10*3/uL (ref 0.0–0.2)
Basos: 0 %
EOS (ABSOLUTE): 0.1 10*3/uL (ref 0.0–0.4)
Eos: 1 %
Hematocrit: 34.6 % (ref 34.0–46.6)
Hemoglobin: 12.3 g/dL (ref 11.1–15.9)
Immature Grans (Abs): 0 10*3/uL (ref 0.0–0.1)
Immature Granulocytes: 0 %
LYMPHS ABS: 1.9 10*3/uL (ref 0.7–3.1)
Lymphs: 27 %
MCH: 33 pg (ref 26.6–33.0)
MCHC: 35.5 g/dL (ref 31.5–35.7)
MCV: 93 fL (ref 79–97)
MONOCYTES: 5 %
MONOS ABS: 0.4 10*3/uL (ref 0.1–0.9)
Neutrophils Absolute: 4.6 10*3/uL (ref 1.4–7.0)
Neutrophils: 67 %
PLATELETS: 236 10*3/uL (ref 150–379)
RBC: 3.73 x10E6/uL — AB (ref 3.77–5.28)
RDW: 13.3 % (ref 12.3–15.4)
WBC: 7 10*3/uL (ref 3.4–10.8)

## 2015-01-07 LAB — RUBELLA SCREEN: RUBELLA: 1.35 {index} (ref >0.99)

## 2015-01-07 LAB — URINALYSIS, ROUTINE W REFLEX MICROSCOPIC
Bilirubin, UA: NEGATIVE
GLUCOSE, UA: NEGATIVE
Ketones, UA: NEGATIVE
LEUKOCYTES UA: NEGATIVE
NITRITE UA: NEGATIVE
Protein, UA: NEGATIVE
RBC, UA: NEGATIVE
Specific Gravity, UA: 1.018 (ref 1.005–1.030)
Urobilinogen, Ur: 0.2 mg/dL (ref 0.2–1.0)
pH, UA: 7 (ref 5.0–7.5)

## 2015-01-07 LAB — ABO AND RH: Rh Factor: POSITIVE

## 2015-01-07 LAB — ANTIBODY SCREEN: ANTIBODY SCREEN: NEGATIVE

## 2015-01-07 LAB — URINE CULTURE

## 2015-01-09 LAB — GC/CHLAMYDIA PROBE AMP
Chlamydia trachomatis, NAA: NEGATIVE
Neisseria gonorrhoeae by PCR: NEGATIVE

## 2015-01-17 ENCOUNTER — Telehealth: Payer: Self-pay | Admitting: Obstetrics and Gynecology

## 2015-01-17 NOTE — Telephone Encounter (Signed)
Notified pt she voiced understanding 

## 2015-01-17 NOTE — Telephone Encounter (Signed)
Pt called and she stated that she is on BP meds and for the past 2 days through out the day she is seeing spots and having tunnel vision, it does go away, but she isn't sure if her meds needs to be adjusted or if her BP is not normal.

## 2015-01-17 NOTE — Telephone Encounter (Signed)
Should i get her to come in???

## 2015-01-17 NOTE — Telephone Encounter (Signed)
Have her stop by for a BP check, but does sound normal, as the blood pressure will fluctuate a lot during first and second trimester. To have her let us know if symptoms last > 2 hours.

## 2015-01-29 NOTE — L&D Delivery Note (Signed)
Delivery Note At 6:57 AM a viable and healthy Female was delivered via Vaginal, Spontaneous Delivery (Presentation: Left Occiput Anterior).  APGAR:7 ,8 ; weight 5#15 - 2700 gm .   Placenta status: Intact, Spontaneous.  Cord: 3 vessels with the following complications: None.  Cord pH: NA  Anesthesia:  Fentanyl Episiotomy:  None Lacerations:  None Suture Repair: NA Est. Blood Loss (mL):  350  Mom to NA.  Baby to Couplet care / Skin to Skin.  Daphine DeutscherMartin A Defrancesco 08/10/2015, 7:07 AM

## 2015-02-07 ENCOUNTER — Encounter: Payer: BC Managed Care – PPO | Admitting: Obstetrics and Gynecology

## 2015-02-16 ENCOUNTER — Encounter: Payer: Self-pay | Admitting: Obstetrics and Gynecology

## 2015-02-16 ENCOUNTER — Ambulatory Visit (INDEPENDENT_AMBULATORY_CARE_PROVIDER_SITE_OTHER): Payer: BC Managed Care – PPO | Admitting: Obstetrics and Gynecology

## 2015-02-16 VITALS — BP 125/73 | HR 84 | Wt 129.1 lb

## 2015-02-16 DIAGNOSIS — O165 Unspecified maternal hypertension, complicating the puerperium: Secondary | ICD-10-CM

## 2015-02-16 DIAGNOSIS — Z331 Pregnant state, incidental: Secondary | ICD-10-CM

## 2015-02-16 LAB — POCT URINALYSIS DIPSTICK
Blood, UA: NEGATIVE
Glucose, UA: NEGATIVE
Ketones, UA: NEGATIVE
LEUKOCYTES UA: NEGATIVE
NITRITE UA: NEGATIVE
PH UA: 6
Spec Grav, UA: 1.025
Urobilinogen, UA: 0.2

## 2015-02-16 NOTE — Patient Instructions (Signed)

## 2015-02-16 NOTE — Progress Notes (Signed)
NOB-pt is having some slight dizzy spells, otherwise denies any complaints

## 2015-02-16 NOTE — Progress Notes (Signed)
NEW OB HISTORY AND PHYSICAL  SUBJECTIVE:       Darlene Leonard is a 25 y.o. [id number] female, Patient's last menstrual period was 11/10/2014., Estimated Date of Delivery: 08/17/15, [redacted]w[redacted]d, presents today for establishment of Prenatal Care. She has no unusual complaints and complains of none      Gynecologic History Patient's last menstrual period was 11/10/2014. Unknown Contraception: none Last Pap: 2016. Results were: normal  Obstetric History OB History  Gravida Para Term Preterm AB SAB TAB Ectopic Multiple Living  0 1    # Outcome Date GA Lbr Len/2nd Weight Sex Delivery Anes PTL Lv  3 Current           2 Term 09/29/14 [redacted]w[redacted]d  6 lb 2.4 oz (2.79 kg) M Vag-Spont   Y  1 SAB 06/12/09 [redacted]w[redacted]d   U    FD     Complications: Status post D&C    Obstetric Comments  D&C 06/20/2013    Past Medical History  Diagnosis Date  . Medical history non-contributory     Past Surgical History  Procedure Laterality Date  . Appendectomy  2011    Current Outpatient Prescriptions on File Prior to Visit  Medication Sig Dispense Refill  . aspirin 81 MG tablet Take 1 tablet (81 mg total) by mouth daily. 120 tablet 4  . folic acid (FOLVITE) 1 MG tablet Take 1 tablet (1 mg total) by mouth daily. 120 tablet 4  . methyldopa (ALDOMET) 500 MG tablet Take 1 tablet (500 mg total) by mouth 2 (two) times daily. 60 tablet 11  . Prenatal Vit-Fe Fumarate-FA (MULTIVITAMIN-PRENATAL) 27-0.8 MG TABS tablet Take 1 tablet by mouth daily at 12 noon.    . ferrous sulfate 325 (65 FE) MG tablet Take 1 tablet (325 mg total) by mouth 2 times daily at 12 noon and 4 pm. (Patient not taking: Reported on 11/11/2014) 60 tablet 0  . ibuprofen (ADVIL,MOTRIN) 600 MG tablet Take 1 tablet (600 mg total) by mouth every 6 (six) hours. (Patient not taking: Reported on 10/14/2014) 30 tablet 0  . oxyCODONE-acetaminophen (PERCOCET/ROXICET) 5-325 MG per tablet Take 2 tablets by mouth every 4 (four) hours as needed (for pain scale  greater than 7). (Patient not taking: Reported on 10/14/2014) 30 tablet 0   No current facility-administered medications on file prior to visit.    No Known Allergies  Social History   Social History  . Marital Status: Single    Spouse Name: N/A  . Number of Children: N/A  . Years of Education: N/A   Occupational History  . Not on file.   Social History Main Topics  . Smoking status: Former Smoker    Quit date: 02/06/2014  . Smokeless tobacco: Never Used  . Alcohol Use: No  . Drug Use: No  . Sexual Activity: No   Other Topics Concern  . Not on file   Social History Narrative    Family History  Problem Relation Age of Onset  . Breast cancer Maternal Aunt   . Heart disease Mother   . Heart disease Paternal Grandmother   . Heart disease Maternal Grandmother   . Diabetes Father   . Diabetes Paternal Uncle     The following portions of the patient's history were reviewed and updated as appropriate: allergies, current medications, past OB history, past medical history, past surgical history, past family history, past social history, and problem list.    OBJECTIVE: Initial Physical Exam (New  OB)  GENERAL APPEARANCE: alert, well appearing, in no apparent distress, oriented to person, place and time HEAD: normocephalic, atraumatic MOUTH: mucous membranes moist, pharynx normal without lesions THYROID: no thyromegaly or masses present BREASTS: no masses noted, no significant tenderness, no palpable axillary nodes, no skin changes LUNGS: clear to auscultation, no wheezes, rales or rhonchi, symmetric air entry HEART: regular rate and rhythm, no murmurs ABDOMEN: soft, nontender, nondistended, no abnormal masses, no epigastric pain, fundus not palpable and FHT present EXTREMITIES: no redness or tenderness in the calves or thighs SKIN: normal coloration and turgor, no rashes LYMPH NODES: no adenopathy palpable NEUROLOGIC: alert, oriented, normal speech, no focal findings or  movement disorder noted  PELVIC EXAM not indicated  ASSESSMENT: Normal pregnancy Hypertension Close interconceptual spacing  PLAN: Prenatal care Desires genetic screening See orders

## 2015-02-27 ENCOUNTER — Encounter: Payer: Self-pay | Admitting: Obstetrics and Gynecology

## 2015-03-15 ENCOUNTER — Encounter: Payer: BC Managed Care – PPO | Admitting: Obstetrics and Gynecology

## 2015-03-24 ENCOUNTER — Encounter: Payer: Self-pay | Admitting: Obstetrics and Gynecology

## 2015-03-24 ENCOUNTER — Ambulatory Visit (INDEPENDENT_AMBULATORY_CARE_PROVIDER_SITE_OTHER): Payer: BC Managed Care – PPO

## 2015-03-24 ENCOUNTER — Ambulatory Visit (INDEPENDENT_AMBULATORY_CARE_PROVIDER_SITE_OTHER): Payer: BC Managed Care – PPO | Admitting: Obstetrics and Gynecology

## 2015-03-24 VITALS — BP 113/69 | HR 89 | Wt 133.6 lb

## 2015-03-24 DIAGNOSIS — Z331 Pregnant state, incidental: Secondary | ICD-10-CM

## 2015-03-24 DIAGNOSIS — Z3492 Encounter for supervision of normal pregnancy, unspecified, second trimester: Secondary | ICD-10-CM

## 2015-03-24 LAB — POCT URINALYSIS DIPSTICK
BILIRUBIN UA: NEGATIVE
GLUCOSE UA: NEGATIVE
Ketones, UA: NEGATIVE
Nitrite, UA: NEGATIVE
Protein, UA: NEGATIVE
SPEC GRAV UA: 1.02
Urobilinogen, UA: 0.2
pH, UA: 6

## 2015-03-24 NOTE — Progress Notes (Signed)
Rob and anatomy scan.

## 2015-03-24 NOTE — Progress Notes (Signed)
Findings:  Singleton intrauterine pregnancy is visualized with FHR at 153 BPM. Biometrics give an (U/S) Gestational age of [redacted] weeks 3 days,  and an (U/S) EDD of 08/22/15; this correlates with the clinically established EDD of 08/21/15.  Fetal presentation is breech, spine posterior.  EFW: 283 grams ( 0 lbs. 8 oz. ). Placenta: Posterior/fundal, remote to cervix, grade 0. AFI: MVP of 4.8 cm, adequate  Anatomic survey is complete and appears WNL. Gender - Female.   Right Ovary measures 2.7 x 2.0 x 2.4 cm. It appears WNL. Left Ovary is not visualized.  There is evidence of a corpus luteal cyst in the right ovary. Survey of the adnexa demonstrates no adnexal masses. There is no free peritoneal fluid in the cul de sac.  Impression: 1. 18 week 3 day Viable Singleton Intrauterine pregnancy by U/S. 2. (U/S) EDD is consistent with Clinically established (LMP) EDD of 08/21/15. 3. Normal appearing anatomy scan.

## 2015-04-20 ENCOUNTER — Ambulatory Visit (INDEPENDENT_AMBULATORY_CARE_PROVIDER_SITE_OTHER): Payer: BC Managed Care – PPO | Admitting: Obstetrics and Gynecology

## 2015-04-20 ENCOUNTER — Encounter: Payer: Self-pay | Admitting: Obstetrics and Gynecology

## 2015-04-20 VITALS — BP 112/74 | HR 105 | Wt 138.0 lb

## 2015-04-20 DIAGNOSIS — Z331 Pregnant state, incidental: Secondary | ICD-10-CM

## 2015-04-20 LAB — POCT URINALYSIS DIPSTICK
Bilirubin, UA: NEGATIVE
Glucose, UA: NEGATIVE
Ketones, UA: NEGATIVE
Leukocytes, UA: NEGATIVE
NITRITE UA: NEGATIVE
PH UA: 7
PROTEIN UA: NEGATIVE
RBC UA: NEGATIVE
SPEC GRAV UA: 1.01
UROBILINOGEN UA: 0.2

## 2015-04-20 NOTE — Progress Notes (Signed)
ROB-pt is doing well, just tired

## 2015-04-20 NOTE — Progress Notes (Signed)
ROB-doing well. glucola next visit. 

## 2015-05-16 ENCOUNTER — Telehealth: Payer: Self-pay | Admitting: *Deleted

## 2015-05-16 MED ORDER — ONDANSETRON 8 MG PO TBDP: 8.0000 mg | ORAL_TABLET | Freq: Three times a day (TID) | ORAL | Status: DC | PRN

## 2015-05-16 NOTE — Telephone Encounter (Signed)
Patient called back and stated that she called the pharnmacy and her RX for Zofran was not there. Please advise. Thanks

## 2015-05-16 NOTE — Telephone Encounter (Signed)
Was just faxed to pharmacy

## 2015-05-16 NOTE — Telephone Encounter (Signed)
Patient called and was wondering what she can take for nausea and vomiting. Pt is [redacted] wks pregnant. She has tried [employer] and is trying to keep herself hydrated. She states that she can't keep anything down. Pt is requesting a call back . Call back [phone]

## 2015-05-16 NOTE — Telephone Encounter (Signed)
Sent in zofran, pt c/o nausea from stomach virus has been through the entire house

## 2015-05-22 ENCOUNTER — Other Ambulatory Visit: Payer: Self-pay | Admitting: *Deleted

## 2015-05-22 DIAGNOSIS — Z331 Pregnant state, incidental: Secondary | ICD-10-CM

## 2015-05-22 DIAGNOSIS — Z131 Encounter for screening for diabetes mellitus: Secondary | ICD-10-CM

## 2015-05-25 ENCOUNTER — Other Ambulatory Visit: Payer: Self-pay

## 2015-05-25 ENCOUNTER — Other Ambulatory Visit: Payer: Self-pay | Admitting: Obstetrics and Gynecology

## 2015-05-25 ENCOUNTER — Encounter: Payer: Self-pay | Admitting: Obstetrics and Gynecology

## 2015-05-25 ENCOUNTER — Ambulatory Visit (INDEPENDENT_AMBULATORY_CARE_PROVIDER_SITE_OTHER): Payer: BC Managed Care – PPO | Admitting: Obstetrics and Gynecology

## 2015-05-25 VITALS — BP 99/60 | HR 86 | Wt 139.9 lb

## 2015-05-25 DIAGNOSIS — Z131 Encounter for screening for diabetes mellitus: Secondary | ICD-10-CM

## 2015-05-25 DIAGNOSIS — Z331 Pregnant state, incidental: Secondary | ICD-10-CM

## 2015-05-25 LAB — POCT URINALYSIS DIPSTICK
Bilirubin, UA: NEGATIVE
Glucose, UA: NEGATIVE
Ketones, UA: NEGATIVE
Leukocytes, UA: NEGATIVE
NITRITE UA: NEGATIVE
PH UA: 7
Protein, UA: NEGATIVE
RBC UA: NEGATIVE
Spec Grav, UA: 1.015
UROBILINOGEN UA: 0.2

## 2015-05-25 NOTE — Progress Notes (Signed)
ROB- glucola done, blood consent signed Pt is doing well 

## 2015-05-25 NOTE — Patient Instructions (Signed)

## 2015-05-25 NOTE — Progress Notes (Signed)
ROB-no complaints, discussed cord blood donation

## 2015-05-26 LAB — GLUCOSE TOLERANCE, 2 HOURS W/ 1HR
GLUCOSE, 1 HOUR: 145 mg/dL (ref 65–179)
GLUCOSE, FASTING: 79 mg/dL (ref 65–91)
Glucose, 2 hour: 133 mg/dL (ref 65–152)

## 2015-05-26 LAB — HEMOGLOBIN

## 2015-05-26 LAB — PLEASE NOTE

## 2015-05-26 LAB — HEMATOCRIT

## 2015-06-15 ENCOUNTER — Encounter: Payer: Self-pay | Admitting: Obstetrics and Gynecology

## 2015-06-15 ENCOUNTER — Ambulatory Visit (INDEPENDENT_AMBULATORY_CARE_PROVIDER_SITE_OTHER): Payer: BC Managed Care – PPO | Admitting: Obstetrics and Gynecology

## 2015-06-15 VITALS — BP 118/67 | HR 87 | Wt 143.4 lb

## 2015-06-15 DIAGNOSIS — Z3493 Encounter for supervision of normal pregnancy, unspecified, third trimester: Secondary | ICD-10-CM

## 2015-06-15 LAB — POCT URINALYSIS DIPSTICK
Blood, UA: NEGATIVE
GLUCOSE UA: NEGATIVE
KETONES UA: NEGATIVE
LEUKOCYTES UA: NEGATIVE
NITRITE UA: NEGATIVE
Protein, UA: NEGATIVE
Spec Grav, UA: 1.025
Urobilinogen, UA: 0.2
pH, UA: 6

## 2015-06-15 NOTE — Progress Notes (Signed)
ROB- doing well, discussed BC; plans OCPs and bottle feeding

## 2015-06-15 NOTE — Progress Notes (Signed)
ROB- pt is doing well denies any new complaints 

## 2015-06-29 ENCOUNTER — Encounter: Payer: BC Managed Care – PPO | Admitting: Obstetrics and Gynecology

## 2015-06-29 ENCOUNTER — Encounter: Payer: Self-pay | Admitting: Obstetrics and Gynecology

## 2015-06-29 ENCOUNTER — Ambulatory Visit (INDEPENDENT_AMBULATORY_CARE_PROVIDER_SITE_OTHER): Payer: Medicaid Other | Admitting: Obstetrics and Gynecology

## 2015-06-29 VITALS — BP 123/63 | HR 82 | Wt 146.4 lb

## 2015-06-29 DIAGNOSIS — Z3493 Encounter for supervision of normal pregnancy, unspecified, third trimester: Secondary | ICD-10-CM

## 2015-06-29 LAB — POCT URINALYSIS DIPSTICK
Bilirubin, UA: NEGATIVE
Blood, UA: NEGATIVE
Glucose, UA: NEGATIVE
Ketones, UA: NEGATIVE
LEUKOCYTES UA: NEGATIVE
Nitrite, UA: NEGATIVE
PH UA: 6
SPEC GRAV UA: 1.015
UROBILINOGEN UA: 0.2

## 2015-06-29 NOTE — Progress Notes (Signed)
ROB- pt is doing great, headed to the beach

## 2015-06-29 NOTE — Progress Notes (Signed)
ROB-doing well, no concerns. 

## 2015-07-13 ENCOUNTER — Ambulatory Visit (INDEPENDENT_AMBULATORY_CARE_PROVIDER_SITE_OTHER): Payer: Medicaid Other | Admitting: Obstetrics and Gynecology

## 2015-07-13 VITALS — BP 116/73 | HR 99 | Wt 146.5 lb

## 2015-07-13 DIAGNOSIS — O163 Unspecified maternal hypertension, third trimester: Secondary | ICD-10-CM

## 2015-07-13 DIAGNOSIS — Z3493 Encounter for supervision of normal pregnancy, unspecified, third trimester: Secondary | ICD-10-CM

## 2015-07-13 DIAGNOSIS — O2613 Low weight gain in pregnancy, third trimester: Secondary | ICD-10-CM

## 2015-07-13 LAB — POCT URINALYSIS DIPSTICK
BILIRUBIN UA: NEGATIVE
Blood, UA: NEGATIVE
GLUCOSE UA: NEGATIVE
KETONES UA: NEGATIVE
Leukocytes, UA: NEGATIVE
Nitrite, UA: NEGATIVE
Spec Grav, UA: 1.025
Urobilinogen, UA: NEGATIVE
pH, UA: 6.5

## 2015-07-13 NOTE — Progress Notes (Signed)
ROB: Patient doing well, no complaints. Concerned about growth of baby.  Patient with fundal height wnl, but poor weight gain (only 13 lbs thus far). Notes regular diet. Encouraged Boost/Ensure supplements with meals. H/o HTN in pregnancy, would recommend growth scan, and if needed NST, to be scheduled.

## 2015-07-21 ENCOUNTER — Other Ambulatory Visit: Payer: Medicaid Other

## 2015-07-21 ENCOUNTER — Ambulatory Visit (INDEPENDENT_AMBULATORY_CARE_PROVIDER_SITE_OTHER): Payer: Medicaid Other | Admitting: Obstetrics and Gynecology

## 2015-07-21 ENCOUNTER — Ambulatory Visit (INDEPENDENT_AMBULATORY_CARE_PROVIDER_SITE_OTHER): Payer: Medicaid Other

## 2015-07-21 ENCOUNTER — Encounter: Payer: Self-pay | Admitting: Obstetrics and Gynecology

## 2015-07-21 VITALS — BP 128/78 | HR 106 | Wt 150.8 lb

## 2015-07-21 DIAGNOSIS — O163 Unspecified maternal hypertension, third trimester: Secondary | ICD-10-CM

## 2015-07-21 DIAGNOSIS — O2613 Low weight gain in pregnancy, third trimester: Secondary | ICD-10-CM

## 2015-07-21 DIAGNOSIS — Z113 Encounter for screening for infections with a predominantly sexual mode of transmission: Secondary | ICD-10-CM

## 2015-07-21 DIAGNOSIS — Z3493 Encounter for supervision of normal pregnancy, unspecified, third trimester: Secondary | ICD-10-CM

## 2015-07-21 DIAGNOSIS — Z36 Encounter for antenatal screening of mother: Secondary | ICD-10-CM

## 2015-07-21 DIAGNOSIS — O133 Gestational [pregnancy-induced] hypertension without significant proteinuria, third trimester: Secondary | ICD-10-CM | POA: Diagnosis not present

## 2015-07-21 DIAGNOSIS — Z3685 Encounter for antenatal screening for Streptococcus B: Secondary | ICD-10-CM

## 2015-07-21 LAB — POCT URINALYSIS DIPSTICK
Bilirubin, UA: NEGATIVE
Glucose, UA: NEGATIVE
Ketones, UA: NEGATIVE
Leukocytes, UA: NEGATIVE
NITRITE UA: NEGATIVE
PH UA: 6
RBC UA: NEGATIVE
Spec Grav, UA: 1.02
UROBILINOGEN UA: 0.2

## 2015-07-21 NOTE — Progress Notes (Signed)
ROB- pt is doing well 

## 2015-07-21 NOTE — Progress Notes (Signed)
ROB- cultures and growth scan. U/S reveals:   Indications: Growth and AFI Findings:  Singleton intrauterine pregnancy is visualized with FHR at 143 BPM. Biometrics give an (U/S) Gestational age of [redacted] weeks and 1 day, and an (U/S) EDD of 08/31/15; this correlates with the clinically established EDD of 08/21/15.  Fetal presentation is vertex, spine left lateral.  EFW: 2331 grams ( 5 lbs. 2 oz.). 23rd % for growth Mayford Knife(Williams). Placenta: Posterior, grade 2 AFI: Adequate at 12.7 cm.    Anatomic survey of the stomach, bladder, lateral ventricle, and kidneys appear WNL.      Impression: 1. 34 week 1 day Viable Singleton Intrauterine pregnancy by U/S. 2. (U/S) EDD is consistent with Clinically established (LMP) EDD of 08/21/15. 3. EFW: 2331 grams ( 5 lbs. 2 oz.) 23rd percentile for growth, Williams 4. AFI adequate at 12.7 cm.

## 2015-07-21 NOTE — Progress Notes (Signed)
NONSTRESS TEST INTERPRETATION  INDICATIONS: h/o of ghtn  FHR baseline: 140  RESULTS:Reactive COMMENTS:    PLAN: 1. Continue fetal kick counts twice a day. 2. Continue antepartum testing as scheduled-Biweekly 3.  Darol Destinerystal Kaleo Condrey, CMA

## 2015-07-24 ENCOUNTER — Other Ambulatory Visit: Payer: Self-pay | Admitting: *Deleted

## 2015-07-24 DIAGNOSIS — Z3685 Encounter for antenatal screening for Streptococcus B: Secondary | ICD-10-CM

## 2015-07-24 DIAGNOSIS — Z113 Encounter for screening for infections with a predominantly sexual mode of transmission: Secondary | ICD-10-CM

## 2015-07-24 DIAGNOSIS — Z3493 Encounter for supervision of normal pregnancy, unspecified, third trimester: Secondary | ICD-10-CM

## 2015-07-24 LAB — OB RESULTS CONSOLE GBS: GBS: NEGATIVE

## 2015-07-24 NOTE — Addendum Note (Signed)
Addended by: Marrianne MoodSIEMIENSKI, Vonna Brabson J on: 07/24/2015 10:14 AM   Modules accepted: Orders

## 2015-07-25 LAB — GC/CHLAMYDIA PROBE AMP
CHLAMYDIA, DNA PROBE: NEGATIVE
NEISSERIA GONORRHOEAE BY PCR: NEGATIVE

## 2015-07-26 LAB — STREP GP B NAA: STREP GROUP B AG: NEGATIVE

## 2015-07-27 ENCOUNTER — Ambulatory Visit (INDEPENDENT_AMBULATORY_CARE_PROVIDER_SITE_OTHER): Payer: Medicaid Other | Admitting: Obstetrics and Gynecology

## 2015-07-27 ENCOUNTER — Encounter: Payer: Self-pay | Admitting: Obstetrics and Gynecology

## 2015-07-27 VITALS — BP 128/80 | HR 82 | Wt 148.9 lb

## 2015-07-27 DIAGNOSIS — Z3493 Encounter for supervision of normal pregnancy, unspecified, third trimester: Secondary | ICD-10-CM

## 2015-07-27 LAB — POCT URINALYSIS DIPSTICK
Blood, UA: NEGATIVE
GLUCOSE UA: NEGATIVE
Ketones, UA: NEGATIVE
LEUKOCYTES UA: NEGATIVE
NITRITE UA: NEGATIVE
SPEC GRAV UA: 1.025
UROBILINOGEN UA: 0.2
pH, UA: 6

## 2015-07-27 NOTE — Progress Notes (Signed)
ROB- lots of pelvic pressure, some contractions

## 2015-07-27 NOTE — H&P (Signed)
Darlene Leonard is a 25 y.o. female presenting for IOL at term secondary to chronic hypertension in pregnancy. History OB History    Gravida Para Term Preterm AB TAB SAB Ectopic Multiple Living   _0 0 1      Obstetric Comments   D&C 06/20/2013     Past Medical History  Diagnosis Date  . Medical history non-contributory    Past Surgical History  Procedure Laterality Date  . Appendectomy  2011   Family History: family history includes Breast cancer in her maternal aunt; Diabetes in her father and paternal uncle; Heart disease in her maternal grandmother, mother, and paternal grandmother. Social History:  reports that she quit smoking about 17 months ago. She has never used smokeless tobacco. She reports that she does not drink alcohol or use illicit drugs.   Prenatal Transfer Tool  Maternal Diabetes: No Genetic Screening: Normal Maternal Ultrasounds/Referrals: Normal Fetal Ultrasounds or other Referrals:  None Maternal Substance Abuse:  No Significant Maternal Medications:  None Significant Maternal Lab Results:  None Other Comments:  None  ROS  Dilation: 1.5 Effacement (%): 50 Station: -2 Blood pressure 128/80, pulse 82, weight 148 lb 14.4 oz (67.541 kg), last menstrual period 11/10/2014, not currently breastfeeding. Exam Physical Exam  A&O x4  well groomed female HRR, Lungs clear Abdomen soft and gravid BP stable on current meds No edema  Prenatal labs: ABO, Rh: O/Positive/-- (12/09 1639) Antibody: Negative (12/09 1639) Rubella: 1.35 (12/09 1639) RPR: Non Reactive (12/09 1639)  HBsAg: Negative (12/09 1639)  HIV: Non Reactive (12/09 1639)  GBS: Negative (06/26 1009)   Assessment/Plan: IOL at term secondary to chronic htn well controlled   Garlen Reinig N Lyncoln Ledgerwood 07/27/2015, 3:15 PM

## 2015-07-27 NOTE — Progress Notes (Signed)
ROB- labor precautions reiterated, scheduled IOL 08/15/15 if not delivered by then.

## 2015-08-03 ENCOUNTER — Ambulatory Visit (INDEPENDENT_AMBULATORY_CARE_PROVIDER_SITE_OTHER): Payer: Medicaid Other | Admitting: Obstetrics and Gynecology

## 2015-08-03 ENCOUNTER — Encounter: Payer: Self-pay | Admitting: Obstetrics and Gynecology

## 2015-08-03 VITALS — BP 144/98 | HR 107 | Wt 150.4 lb

## 2015-08-03 DIAGNOSIS — Z3493 Encounter for supervision of normal pregnancy, unspecified, third trimester: Secondary | ICD-10-CM | POA: Diagnosis not present

## 2015-08-03 LAB — POCT URINALYSIS DIPSTICK
BILIRUBIN UA: NEGATIVE
GLUCOSE UA: NEGATIVE
KETONES UA: NEGATIVE
Leukocytes, UA: NEGATIVE
Nitrite, UA: NEGATIVE
SPEC GRAV UA: 1.025
Urobilinogen, UA: 0.2
pH, UA: 6.5

## 2015-08-03 MED ORDER — METHYLDOPA 500 MG PO TABS: 500.0000 mg | ORAL_TABLET | Freq: Three times a day (TID) | ORAL | Status: DC

## 2015-08-03 NOTE — Progress Notes (Signed)
BP readings from NST- 151/86,135/84,143/83

## 2015-08-03 NOTE — Progress Notes (Signed)
ROB- BP elevated at first, came down while on NST.  NST reactive, Changed Induction to 4pm on 7/16, also to increase BP med to tid

## 2015-08-03 NOTE — Progress Notes (Signed)
ROB- pt BP is elevated, having some headaches and some dizziness, SOB, pelvic pressure

## 2015-08-09 ENCOUNTER — Other Ambulatory Visit: Payer: Self-pay | Admitting: Obstetrics and Gynecology

## 2015-08-09 ENCOUNTER — Other Ambulatory Visit (INDEPENDENT_AMBULATORY_CARE_PROVIDER_SITE_OTHER): Payer: Medicaid Other

## 2015-08-09 ENCOUNTER — Inpatient Hospital Stay
Admission: EM | Admit: 2015-08-09 | Discharge: 2015-08-11 | DRG: 775 | Disposition: A | Discharge status: Home / Self Care | Payer: Medicaid Other | Attending: Obstetrics and Gynecology | Admitting: Obstetrics and Gynecology

## 2015-08-09 ENCOUNTER — Ambulatory Visit (INDEPENDENT_AMBULATORY_CARE_PROVIDER_SITE_OTHER): Payer: Medicaid Other | Admitting: Obstetrics and Gynecology

## 2015-08-09 VITALS — BP 114/72 | HR 99 | Wt 153.5 lb

## 2015-08-09 DIAGNOSIS — D509 Iron deficiency anemia, unspecified: Secondary | ICD-10-CM

## 2015-08-09 DIAGNOSIS — Z3A38 38 weeks gestation of pregnancy: Secondary | ICD-10-CM

## 2015-08-09 DIAGNOSIS — D696 Thrombocytopenia, unspecified: Secondary | ICD-10-CM | POA: Diagnosis present

## 2015-08-09 DIAGNOSIS — O165 Unspecified maternal hypertension, complicating the puerperium: Secondary | ICD-10-CM

## 2015-08-09 DIAGNOSIS — Z87891 Personal history of nicotine dependence: Secondary | ICD-10-CM

## 2015-08-09 DIAGNOSIS — D649 Anemia, unspecified: Secondary | ICD-10-CM | POA: Diagnosis present

## 2015-08-09 DIAGNOSIS — O9912 Other diseases of the blood and blood-forming organs and certain disorders involving the immune mechanism complicating childbirth: Secondary | ICD-10-CM | POA: Diagnosis present

## 2015-08-09 DIAGNOSIS — O134 Gestational [pregnancy-induced] hypertension without significant proteinuria, complicating childbirth: Secondary | ICD-10-CM | POA: Diagnosis present

## 2015-08-09 DIAGNOSIS — O9902 Anemia complicating childbirth: Secondary | ICD-10-CM | POA: Diagnosis present

## 2015-08-09 DIAGNOSIS — O24410 Gestational diabetes mellitus in pregnancy, diet controlled: Secondary | ICD-10-CM | POA: Diagnosis not present

## 2015-08-09 DIAGNOSIS — Z3483 Encounter for supervision of other normal pregnancy, third trimester: Secondary | ICD-10-CM | POA: Diagnosis not present

## 2015-08-09 DIAGNOSIS — Z23 Encounter for immunization: Secondary | ICD-10-CM | POA: Diagnosis not present

## 2015-08-09 DIAGNOSIS — O163 Unspecified maternal hypertension, third trimester: Secondary | ICD-10-CM

## 2015-08-09 DIAGNOSIS — O10919 Unspecified pre-existing hypertension complicating pregnancy, unspecified trimester: Secondary | ICD-10-CM | POA: Diagnosis present

## 2015-08-09 DIAGNOSIS — I10 Essential (primary) hypertension: Secondary | ICD-10-CM

## 2015-08-09 DIAGNOSIS — Z3490 Encounter for supervision of normal pregnancy, unspecified, unspecified trimester: Secondary | ICD-10-CM

## 2015-08-09 LAB — PROTEIN / CREATININE RATIO, URINE
Creatinine, Urine: 242 mg/dL
PROTEIN CREATININE RATIO: 0.15 mg/mg{creat} (ref 0.00–0.15)
Total Protein, Urine: 37 mg/dL

## 2015-08-09 LAB — COMPREHENSIVE METABOLIC PANEL
ALBUMIN: 2.5 g/dL — AB (ref 3.5–5.0)
ALK PHOS: 110 U/L (ref 38–126)
ALT: 11 U/L — ABNORMAL LOW (ref 14–54)
ANION GAP: 7 (ref 5–15)
AST: 19 U/L (ref 15–41)
BUN: 11 mg/dL (ref 6–20)
CHLORIDE: 105 mmol/L (ref 101–111)
CO2: 24 mmol/L (ref 22–32)
Calcium: 8.2 mg/dL — ABNORMAL LOW (ref 8.9–10.3)
Creatinine, Ser: 0.61 mg/dL (ref 0.44–1.00)
GFR calc Af Amer: >60 mL/min (ref >60)
GFR calc non Af Amer: >60 mL/min (ref >60)
GLUCOSE: 78 mg/dL (ref 65–99)
POTASSIUM: 3.8 mmol/L (ref 3.5–5.1)
SODIUM: 136 mmol/L (ref 135–145)
Total Bilirubin: 0.3 mg/dL (ref 0.3–1.2)
Total Protein: 6 g/dL — ABNORMAL LOW (ref 6.5–8.1)

## 2015-08-09 LAB — CBC
HCT: 27.5 % — ABNORMAL LOW (ref 35.0–47.0)
HEMOGLOBIN: 9.5 g/dL — AB (ref 12.0–16.0)
MCH: 33.8 pg (ref 26.0–34.0)
MCHC: 34.7 g/dL (ref 32.0–36.0)
MCV: 97.4 fL (ref 80.0–100.0)
Platelets: 104 10*3/uL — ABNORMAL LOW (ref 150–440)
RBC: 2.83 MIL/uL — AB (ref 3.80–5.20)
RDW: 14.3 % (ref 11.5–14.5)
WBC: 4 10*3/uL (ref 3.6–11.0)

## 2015-08-09 LAB — POCT URINALYSIS DIPSTICK
BILIRUBIN UA: NEGATIVE
Blood, UA: NEGATIVE
Glucose, UA: NEGATIVE
Ketones, UA: NEGATIVE
LEUKOCYTES UA: NEGATIVE
NITRITE UA: NEGATIVE
PH UA: 6
Spec Grav, UA: 1.015
UROBILINOGEN UA: 0.2

## 2015-08-09 LAB — TYPE AND SCREEN
ABO/RH(D): O POS
ANTIBODY SCREEN: NEGATIVE

## 2015-08-09 MED ORDER — LIDOCAINE HCL (PF) 1 % IJ SOLN: 30.0000 mL | INTRAMUSCULAR | Status: DC | PRN

## 2015-08-09 MED ORDER — FENTANYL CITRATE (PF) 100 MCG/2ML IJ SOLN
50.0000 ug | INTRAMUSCULAR | Status: DC | PRN
  Administered 2015-08-10 (×2): 50 ug via INTRAVENOUS
  Filled 2015-08-09 (×2): qty 2

## 2015-08-09 MED ORDER — LACTATED RINGERS IV SOLN: 500.0000 mL | INTRAVENOUS | Status: DC | PRN

## 2015-08-09 MED ORDER — LACTATED RINGERS IV SOLN
INTRAVENOUS | Status: DC
  Administered 2015-08-09 – 2015-08-10 (×2): via INTRAVENOUS

## 2015-08-09 MED ORDER — OXYTOCIN 40 UNITS IN LACTATED RINGERS INFUSION - SIMPLE MED
2.5000 [IU]/h | INTRAVENOUS | Status: DC
  Administered 2015-08-10: 39.96 [IU]/h via INTRAVENOUS
  Filled 2015-08-09: qty 1000

## 2015-08-09 MED ORDER — SOD CITRATE-CITRIC ACID 500-334 MG/5ML PO SOLN: 30.0000 mL | ORAL | Status: DC | PRN

## 2015-08-09 MED ORDER — TERBUTALINE SULFATE 1 MG/ML IJ SOLN: 0.2500 mg | Freq: Once | INTRAMUSCULAR | Status: DC | PRN

## 2015-08-09 MED ORDER — OXYTOCIN 40 UNITS IN LACTATED RINGERS INFUSION - SIMPLE MED: 1.0000 m[IU]/min | INTRAVENOUS | Status: DC

## 2015-08-09 MED ORDER — ONDANSETRON HCL 4 MG/2ML IJ SOLN
4.0000 mg | Freq: Four times a day (QID) | INTRAMUSCULAR | Status: DC | PRN
  Administered 2015-08-10: 4 mg via INTRAVENOUS
  Filled 2015-08-09: qty 2

## 2015-08-09 MED ORDER — MISOPROSTOL 25 MCG QUARTER TABLET
25.0000 ug | ORAL_TABLET | ORAL | Status: DC | PRN
  Administered 2015-08-09: 25 ug via VAGINAL
  Filled 2015-08-09 (×2): qty 1

## 2015-08-09 MED ORDER — SODIUM CHLORIDE 0.9 % IV SOLN: 1.0000 g | INTRAVENOUS | Status: DC

## 2015-08-09 MED ORDER — ACETAMINOPHEN 325 MG PO TABS: 650.0000 mg | ORAL_TABLET | ORAL | Status: DC | PRN

## 2015-08-09 MED ORDER — METHYLDOPA 250 MG PO TABS
500.0000 mg | ORAL_TABLET | Freq: Two times a day (BID) | ORAL | Status: DC
  Administered 2015-08-09 – 2015-08-11 (×4): 500 mg via ORAL
  Filled 2015-08-09: qty 1
  Filled 2015-08-09 (×2): qty 2
  Filled 2015-08-09: qty 1

## 2015-08-09 MED ORDER — OXYTOCIN BOLUS FROM INFUSION: 500.0000 mL | INTRAVENOUS | Status: DC

## 2015-08-09 NOTE — H&P (Addendum)
Darlene Leonard is a 25 y.o. female presenting for Cytotec/Pitocin IOL at 38.6 weeks [facility] and non reassuring FHR tracing. A/P testing in office was notable for episode of suspected fetal bradycardia for five minutes and spontaneous deceleration. Urine protein was +1 in office; History of Gestational HTN this pregnancy and history of post partum hypertension last pregnancy. Pt denies PIH symptoms.  U/S in office notable for adequate fluid; growth parameters are lagging behind.  History OB History    Gravida Para Term Preterm AB TAB SAB Ectopic Multiple Living   3 1 1  1  1   0 1      Obstetric Comments   D&C 06/20/2013     Past Medical History  Diagnosis Date  . Medical history non-contributory    Past Surgical History  Procedure Laterality Date  . Appendectomy  2011   Family History: family history includes Breast cancer in her maternal aunt; Diabetes in her father and paternal uncle; Heart disease in her maternal grandmother, mother, and paternal grandmother. Social History:  reports that she quit smoking about 18 months ago. She has never used smokeless tobacco. She reports that she does not drink alcohol or use illicit drugs.   Prenatal Transfer Tool  Maternal Diabetes: No Genetic Screening: Normal Maternal Ultrasounds/Referrals: Normal Fetal Ultrasounds or other Referrals:  None Maternal Substance Abuse:  No Significant Maternal Medications:  None Significant Maternal Lab Results:  None Other Comments:  None  ROS  Per HPI  BP 119/75 mmHg  Pulse 75  Temp(Src) 97.7 F (36.5 C) (Oral)  Resp 18  Ht 5\' 2"  (1.575 m)  Wt 153 lb 8 oz (69.627 kg)  BMI 28.07 kg/m2  LMP 11/10/2014   Dilation: 2 Effacement (%): 50 Station: -2 Blood pressure 119/75, pulse 75, temperature 97.7 F (36.5 C), temperature source Oral, resp. rate 18, height 5\' 2"  (1.575 m), weight 153 lb 8 oz (69.627 kg), last menstrual period 11/10/2014, not currently  breastfeeding. Exam Physical Exam  Constitutional: She is oriented to person, place, and time. She appears well-developed and well-nourished.  HENT:  Head: Normocephalic.  Neck: Neck supple. No thyromegaly present.  Cardiovascular: Normal rate and regular rhythm.   Respiratory: Effort normal.  GI: She exhibits no distension. There is no tenderness.  Genitourinary:  1.5/50/-3/vtx/bowi  Musculoskeletal: Normal range of motion. She exhibits edema. She exhibits no tenderness.  Lymphadenopathy:    She has no cervical adenopathy.  Neurological: She is alert and oriented to person, place, and time. She has normal reflexes.  Skin: Skin is warm and dry.  Psychiatric: She has a normal mood and affect.   EFW 6#10 by U/S today  Prenatal labs: ABO, Rh: O/+/ (07/12 1818) Antibody: Negative (07/12 1818) Rubella: 1.35 (12/09 1639) RPR: Non Reactive (12/09 1639)  HBsAg: Negative (12/09 1639)  HIV: Non Reactive (12/09 1639)  GBS: Negative (06/26 1009)   CBC Latest Ref Rng 08/09/2015 05/25/2015 01/06/2015  WBC 3.6 - 11.0 K/uL 4.0 - 7.0  Hemoglobin 12.0 - 16.0 g/dL 0.4(V9.5(L) - -  Hematocrit 35.0 - 47.0 % 27.5(L) CANCELED 34.6  Platelets 150 - 440 K/uL 104(L) - 236     Assessment/Plan: 38.6 week IUP with Gestational HTN and Non reassuring A/P testing GBS negative Anemia Thrombocytopenia   Plan: PIH panel; Cytotec/Pitocin IOL   Daphine DeutscherMartin A Kerin Kren 08/09/2015, 6:50 PM

## 2015-08-09 NOTE — Progress Notes (Deleted)
Returnto

## 2015-08-09 NOTE — OB Triage Note (Signed)
Pt arrived from clinic via ED with need of fetal heart rate evaluation. Pt placed on monitor and oriented to room.

## 2015-08-09 NOTE — Progress Notes (Signed)
NONSTRESS TEST INTERPRETATION  INDICATIONS: PIH  FHR baseline: 120 RESULTS: NON-REACTIVE COMMENTS: Unable to determine at one point early while doing NST if decel due to baby kicking monitor off, lines were inconclusive but when trying to reposition fhr 60-117 but did find fhr 120-130. Pt noted to have decel later with fhr 78.   ADDENDUM: NST nonreactive with an episode of fetal bradycardia and subsequent variable decelerations. Plan: Induction of labor and delivery   PLAN: 1. [facility]. L&D notified. 2. Induction of labor and delivery anticipated  Fenton Mallingebbie Ridgeway, LPN  Herold HarmsMartin A Artisha Capri, MD

## 2015-08-10 ENCOUNTER — Encounter: Payer: Self-pay | Admitting: *Deleted

## 2015-08-10 DIAGNOSIS — Z3483 Encounter for supervision of other normal pregnancy, third trimester: Secondary | ICD-10-CM

## 2015-08-10 LAB — RPR: RPR Ser Ql: NONREACTIVE

## 2015-08-10 LAB — PLATELET COUNT: PLATELETS: 105 10*3/uL — AB (ref 150–440)

## 2015-08-10 MED ORDER — BENZOCAINE-MENTHOL 20-0.5 % EX AERO
1.0000 | INHALATION_SPRAY | CUTANEOUS | Status: DC | PRN
Start: 2015-08-10 — End: 2015-08-11

## 2015-08-10 MED ORDER — IBUPROFEN 800 MG PO TABS
800.0000 mg | ORAL_TABLET | Freq: Three times a day (TID) | ORAL | Status: DC
  Administered 2015-08-10 – 2015-08-11 (×3): 800 mg via ORAL
  Filled 2015-08-10 (×3): qty 1

## 2015-08-10 MED ORDER — IBUPROFEN 800 MG PO TABS
800.0000 mg | ORAL_TABLET | Freq: Three times a day (TID) | ORAL | Status: DC
  Administered 2015-08-10: 800 mg via ORAL
  Filled 2015-08-10: qty 1

## 2015-08-10 MED ORDER — MORPHINE SULFATE (PF) 10 MG/ML IV SOLN
INTRAVENOUS | Status: AC
  Administered 2015-08-10: 10 mg via INTRAMUSCULAR
  Filled 2015-08-10: qty 1

## 2015-08-10 MED ORDER — AMMONIA AROMATIC IN INHA
RESPIRATORY_TRACT | Status: AC
  Filled 2015-08-10: qty 10

## 2015-08-10 MED ORDER — ACETAMINOPHEN 325 MG PO TABS
650.0000 mg | ORAL_TABLET | ORAL | Status: DC | PRN
  Administered 2015-08-11 (×2): 650 mg via ORAL
  Filled 2015-08-10 (×2): qty 2

## 2015-08-10 MED ORDER — PRENATAL MULTIVITAMIN CH
1.0000 | ORAL_TABLET | Freq: Every day | ORAL | Status: DC
Start: 2015-08-10 — End: 2015-08-11
  Administered 2015-08-10 – 2015-08-11 (×2): 1 via ORAL
  Filled 2015-08-10 (×2): qty 1

## 2015-08-10 MED ORDER — MORPHINE SULFATE (PF) 10 MG/ML IV SOLN
10.0000 mg | Freq: Once | INTRAVENOUS | Status: AC
  Administered 2015-08-10: 10 mg via INTRAMUSCULAR

## 2015-08-10 MED ORDER — SIMETHICONE 80 MG PO CHEW: 80.0000 mg | CHEWABLE_TABLET | ORAL | Status: DC | PRN

## 2015-08-10 MED ORDER — ONDANSETRON HCL 4 MG/2ML IJ SOLN: 4.0000 mg | INTRAMUSCULAR | Status: DC | PRN

## 2015-08-10 MED ORDER — ONDANSETRON HCL 4 MG PO TABS: 4.0000 mg | ORAL_TABLET | ORAL | Status: DC | PRN

## 2015-08-10 MED ORDER — WITCH HAZEL-GLYCERIN EX PADS: 1.0000 "application " | MEDICATED_PAD | CUTANEOUS | Status: DC | PRN

## 2015-08-10 MED ORDER — TETANUS-DIPHTH-ACELL PERTUSSIS 5-2.5-18.5 LF-MCG/0.5 IM SUSP
0.5000 mL | INTRAMUSCULAR | Status: AC | PRN
  Administered 2015-08-11: 0.5 mL via INTRAMUSCULAR
  Filled 2015-08-10: qty 0.5

## 2015-08-10 MED ORDER — DIPHENHYDRAMINE HCL 25 MG PO CAPS: 25.0000 mg | ORAL_CAPSULE | Freq: Four times a day (QID) | ORAL | Status: DC | PRN

## 2015-08-10 MED ORDER — OXYTOCIN 40 UNITS IN LACTATED RINGERS INFUSION - SIMPLE MED
2.5000 [IU]/h | INTRAVENOUS | Status: DC | PRN
  Filled 2015-08-10: qty 1000

## 2015-08-10 MED ORDER — COCONUT OIL OIL: 1.0000 "application " | TOPICAL_OIL | Status: DC | PRN

## 2015-08-10 MED ORDER — MISOPROSTOL 200 MCG PO TABS
ORAL_TABLET | ORAL | Status: AC
  Filled 2015-08-10: qty 4

## 2015-08-10 MED ORDER — DOCUSATE SODIUM 100 MG PO CAPS
100.0000 mg | ORAL_CAPSULE | Freq: Two times a day (BID) | ORAL | Status: DC
  Administered 2015-08-10 – 2015-08-11 (×2): 100 mg via ORAL
  Filled 2015-08-10 (×2): qty 1

## 2015-08-10 MED ORDER — FERROUS SULFATE 325 (65 FE) MG PO TABS
325.0000 mg | ORAL_TABLET | Freq: Two times a day (BID) | ORAL | Status: DC
Start: 2015-08-10 — End: 2015-08-11
  Administered 2015-08-10 – 2015-08-11 (×2): 325 mg via ORAL
  Filled 2015-08-10 (×2): qty 1

## 2015-08-10 MED ORDER — MEASLES, MUMPS & RUBELLA VAC ~~LOC~~ INJ
0.5000 mL | INJECTION | Freq: Once | SUBCUTANEOUS | Status: DC
  Filled 2015-08-10: qty 0.5

## 2015-08-10 MED ORDER — DIBUCAINE 1 % RE OINT: 1.0000 "application " | TOPICAL_OINTMENT | RECTAL | Status: DC | PRN

## 2015-08-10 MED ORDER — LIDOCAINE HCL (PF) 1 % IJ SOLN
INTRAMUSCULAR | Status: AC
  Filled 2015-08-10: qty 30

## 2015-08-11 DIAGNOSIS — I10 Essential (primary) hypertension: Secondary | ICD-10-CM

## 2015-08-11 LAB — CBC
HEMATOCRIT: 27.1 % — AB (ref 35.0–47.0)
Hemoglobin: 9.4 g/dL — ABNORMAL LOW (ref 12.0–16.0)
MCH: 34 pg (ref 26.0–34.0)
MCHC: 34.8 g/dL (ref 32.0–36.0)
MCV: 97.8 fL (ref 80.0–100.0)
Platelets: 96 10*3/uL — ABNORMAL LOW (ref 150–440)
RBC: 2.78 MIL/uL — ABNORMAL LOW (ref 3.80–5.20)
RDW: 14.4 % (ref 11.5–14.5)
WBC: 5 10*3/uL (ref 3.6–11.0)

## 2015-08-11 MED ORDER — IBUPROFEN 800 MG PO TABS: 800.0000 mg | ORAL_TABLET | Freq: Three times a day (TID) | ORAL | Status: DC

## 2015-08-11 MED ORDER — FERROUS SULFATE 325 (65 FE) MG PO TABS: 325.0000 mg | ORAL_TABLET | Freq: Two times a day (BID) | ORAL | Status: DC

## 2015-08-11 MED ORDER — DOCUSATE SODIUM 100 MG PO CAPS: 100.0000 mg | ORAL_CAPSULE | Freq: Two times a day (BID) | ORAL | Status: DC

## 2015-08-11 NOTE — Progress Notes (Signed)
Patient understands all discharge instructions and the need to make follow up appointments and attend repeat hearing screen. Patient discharge via wheelchair with auxillary.

## 2015-08-11 NOTE — Discharge Summary (Signed)
Physician Obstetric Discharge Summary  Patient ID: DURA MCCORMACK MRN: [id number] DOB/AGE: [birth year] 25 y.o.   Date of Admission: 08/09/2015  Date of Discharge:   Admitting Diagnosis: Induction of labor and Chronic hypertension, nonreassuring fetal heart rate tracing at [redacted]w[redacted]d  Secondary Diagnosis: Anemia in pregnancy  Mode of Delivery: normal spontaneous vaginal delivery     Discharge Diagnosis: SVD; chronic hypertension; anemia   Intrapartum Procedures: Atificial rupture of membranes and Cytotec induction of labor   Post partum procedures: None  Complications: none   Brief Hospital Course  Darlene Leonard is a [id number] who had a SVD on 08/10/2015;  for further details of this surgery, please refer to the delivey note.  Patient had an uncomplicated postpartum course.  By time of discharge on PPD#1, her pain was controlled on oral pain medications; she had appropriate lochia and was ambulating, voiding without difficulty and tolerating regular diet.  She was deemed stable for discharge to home.    Labs: CBC Latest Ref Rng 08/11/2015 08/10/2015 08/09/2015  WBC 3.6 - 11.0 K/uL 5.0 - 4.0  Hemoglobin 12.0 - 16.0 g/dL 3.6(U) - 9.5(L)  Hematocrit 35.0 - 47.0 % 27.1(L) - 27.5(L)  Platelets 150 - 440 K/uL 96(L) 105(L) 104(L)   O POS  Physical exam:  Blood pressure 127/79, pulse 69, temperature 98.9 F (37.2 C), temperature source Oral, resp. rate 18, height  (1.575 m), weight 153 lb 8 oz (69.627 kg), last menstrual period 11/10/2014, SpO2 97 %, unknown if currently breastfeeding. General: alert and no distress Lochia: appropriate Abdomen: soft, NT Uterine Fundus: firm Extremities: No evidence of DVT seen on physical exam. No lower extremity edema.  Discharge Instructions: Per After Visit Summary. Activity: Advance as tolerated. Pelvic rest for 6 weeks.  Also refer to After Visit Summary Diet: Regular Medications:   Medication List    STOP taking these medications        aspirin 81 MG tablet      TAKE these medications        docusate sodium 100 MG capsule  Commonly known as:  COLACE  Take 1 capsule (100 mg total) by mouth 2 (two) times daily.     ferrous sulfate 325 (65 FE) MG tablet  Take 1 tablet (325 mg total) by mouth 2 (two) times daily with a meal.     ibuprofen 800 MG tablet  Commonly known as:  ADVIL,MOTRIN  Take 1 tablet (800 mg total) by mouth 3 (three) times daily.     methyldopa 500 MG tablet  Commonly known as:  ALDOMET  Take 1 tablet (500 mg total) by mouth 3 (three) times daily.     multivitamin-prenatal 27-0.8 MG Tabs tablet  Take 1 tablet by mouth daily at 12 noon.       Outpatient follow up:      Follow-up Information    Follow up with Melody Suzan Nailer, CNM. Schedule an appointment as soon as possible for a visit in 6 weeks.   Specialties:  Obstetrics and Gynecology, Radiology   Why:  6 week Post Partum Check   Contact information:   [address] [place] [address] [phone]      Postpartum contraception: oral contraceptives (estrogen/progesterone) begin taking OCPs in 2 weeks  Discharged Condition: good  Discharged to: home   Newborn Data: Disposition:home with mother  Apgars: APGAR (1 MIN): 7   APGAR (5 MINS): 7   APGAR (10 MINS):    Baby Feeding: Bottle  Daphine Deutscher  A Beda Dula, MD

## 2015-08-15 ENCOUNTER — Telehealth: Payer: Self-pay | Admitting: Obstetrics and Gynecology

## 2015-08-15 ENCOUNTER — Other Ambulatory Visit: Payer: Self-pay | Admitting: Obstetrics and Gynecology

## 2015-08-15 MED ORDER — NORETHINDRONE 0.35 MG PO TABS: 1.0000 | ORAL_TABLET | Freq: Every day | ORAL | Status: DC

## 2015-08-15 NOTE — Telephone Encounter (Signed)
Is she wanting pills or depo?

## 2015-08-15 NOTE — Telephone Encounter (Signed)
Pt wants pills please

## 2015-08-15 NOTE — Telephone Encounter (Signed)
pls advise

## 2015-08-15 NOTE — Telephone Encounter (Signed)
Darl[facility] Leonard [facility]

## 2015-09-26 ENCOUNTER — Ambulatory Visit (INDEPENDENT_AMBULATORY_CARE_PROVIDER_SITE_OTHER): Payer: Medicaid Other | Admitting: Obstetrics and Gynecology

## 2015-09-26 ENCOUNTER — Encounter: Payer: Self-pay | Admitting: Obstetrics and Gynecology

## 2015-09-26 VITALS — BP 130/100 | HR 98 | Ht 63.0 in | Wt 132.2 lb

## 2015-09-26 DIAGNOSIS — O9081 Anemia of the puerperium: Secondary | ICD-10-CM

## 2015-09-26 NOTE — Progress Notes (Signed)
  Subjective:     Darlene Leonard is a 25 y.o. female who presents for a postpartum visit. She is 6 weeks postpartum following a spontaneous vaginal delivery. I have fully reviewed the prenatal and intrapartum course. The delivery was at 38 gestational weeks. Outcome: spontaneous vaginal delivery. Anesthesia: none. Postpartum course has been uncomplicated. Baby's course has been uncomplicated. Baby is feeding by formula. Bleeding no bleeding. Bowel function is normal. Bladder function is normal. Patient is sexually active. Contraception method is oral progesterone-only contraceptive. Postpartum depression screening: negative. Forgot BP meds this morning.   The following portions of the patient's history were reviewed and updated as appropriate: allergies, current medications, past family history, past medical history, past social history, past surgical history and problem list.  Review of Systems A comprehensive review of systems was negative.   Objective:    BP (!) 130/100   Pulse 98   Ht 5\' 3"  (1.6 m)   Wt 132 lb 3.2 oz (60 kg)   LMP 09/19/2015   Breastfeeding? No   BMI 23.42 kg/m   General:  alert, cooperative and appears stated age   Breasts:  not indicated  Lungs: clear to auscultation bilaterally  Heart:  regular rate and rhythm, S1, S2 normal, no murmur, click, rub or gallop  Abdomen: soft, non-tender; bowel sounds normal; no masses,  no organomegaly   Vulva:  normal  Vagina: normal vagina  Cervix:  multiparous appearance  Corpus: normal size, contour, position, consistency, mobility, non-tender  Adnexa:  normal adnexa and no mass, fullness, tenderness  Rectal Exam: Not performed.        Assessment:     6 weeks postpartum exam. Pap smear not done at today's visit. chronic hypertension  Plan:    1. Contraception: oral progesterone-only contraceptive, spouse is planning vasectomy 2. Chronic hypertension- continue current meds 3. Follow up in: 4 months for AE or as needed.

## 2015-09-26 NOTE — Patient Instructions (Signed)
  Place postpartum visit patient instructions here.  

## 2015-09-27 LAB — COMPREHENSIVE METABOLIC PANEL
A/G RATIO: 1.7 (ref 1.2–2.2)
ALK PHOS: 77 IU/L (ref 39–117)
ALT: 71 IU/L — AB (ref 0–32)
AST: 32 IU/L (ref 0–40)
Albumin: 4.5 g/dL (ref 3.5–5.5)
BILIRUBIN TOTAL: 0.3 mg/dL (ref 0.0–1.2)
BUN/Creatinine Ratio: 11 (ref 9–23)
BUN: 9 mg/dL (ref 6–20)
CHLORIDE: 102 mmol/L (ref 96–106)
CO2: 22 mmol/L (ref 18–29)
Calcium: 9.4 mg/dL (ref 8.7–10.2)
Creatinine, Ser: 0.82 mg/dL (ref 0.57–1.00)
GFR calc Af Amer: 114 mL/min/{1.73_m2} (ref >59)
GFR calc non Af Amer: 99 mL/min/{1.73_m2} (ref >59)
GLUCOSE: 83 mg/dL (ref 65–99)
Globulin, Total: 2.7 g/dL (ref 1.5–4.5)
POTASSIUM: 4.2 mmol/L (ref 3.5–5.2)
Sodium: 141 mmol/L (ref 134–144)
TOTAL PROTEIN: 7.2 g/dL (ref 6.0–8.5)

## 2015-09-27 LAB — CBC
Hematocrit: 39.8 % (ref 34.0–46.6)
Hemoglobin: 13.3 g/dL (ref 11.1–15.9)
MCH: 31.9 pg (ref 26.6–33.0)
MCHC: 33.4 g/dL (ref 31.5–35.7)
MCV: 95 fL (ref 79–97)
PLATELETS: 212 10*3/uL (ref 150–379)
RBC: 4.17 x10E6/uL (ref 3.77–5.28)
RDW: 14.1 % (ref 12.3–15.4)
WBC: 5.6 10*3/uL (ref 3.4–10.8)

## 2015-09-27 LAB — VITAMIN D 25 HYDROXY (VIT D DEFICIENCY, FRACTURES): VIT D 25 HYDROXY: 41.7 ng/mL (ref 30.0–100.0)

## 2015-09-27 LAB — IRON: Iron: 120 ug/dL (ref 27–159)

## 2016-01-16 ENCOUNTER — Encounter: Payer: Self-pay | Admitting: Obstetrics and Gynecology

## 2016-01-16 ENCOUNTER — Ambulatory Visit (INDEPENDENT_AMBULATORY_CARE_PROVIDER_SITE_OTHER): Payer: Medicaid Other | Admitting: Obstetrics and Gynecology

## 2016-01-16 ENCOUNTER — Other Ambulatory Visit: Payer: Self-pay | Admitting: Obstetrics and Gynecology

## 2016-01-16 VITALS — BP 124/84 | HR 91 | Ht 62.0 in | Wt 130.2 lb

## 2016-01-16 DIAGNOSIS — Z01411 Encounter for gynecological examination (general) (routine) with abnormal findings: Secondary | ICD-10-CM

## 2016-01-16 DIAGNOSIS — Z23 Encounter for immunization: Secondary | ICD-10-CM | POA: Diagnosis not present

## 2016-01-16 MED ORDER — METHYLDOPA 500 MG PO TABS: 500.0000 mg | ORAL_TABLET | Freq: Three times a day (TID) | ORAL | 11 refills | Status: DC

## 2016-01-16 MED ORDER — DESOGESTREL-ETHINYL ESTRADIOL 0.15-0.02/0.01 MG (21/5) PO TABS: 1.0000 | ORAL_TABLET | Freq: Every day | ORAL | 11 refills | Status: DC

## 2016-01-16 NOTE — Addendum Note (Signed)
Addended by: Rosine BeatLONTZ, Chevette Fee L on: 01/16/2016 04:47 PM   Modules accepted: Orders

## 2016-01-16 NOTE — Patient Instructions (Signed)
 Preventive Care 18-39 Years, Female Preventive care refers to lifestyle choices and visits with your health care provider that can promote health and wellness. What does preventive care include?  A yearly physical exam. This is also called an annual well check.  Dental exams once or twice a year.  Routine eye exams. Ask your health care provider how often you should have your eyes checked.  Personal lifestyle choices, including:  Daily care of your teeth and gums.  Regular physical activity.  Eating a healthy diet.  Avoiding tobacco and drug use.  Limiting alcohol use.  Practicing safe sex.  Taking vitamin and mineral supplements as recommended by your health care provider. What happens during an annual well check? The services and screenings done by your health care provider during your annual well check will depend on your age, overall health, lifestyle risk factors, and family history of disease. Counseling  Your health care provider may ask you questions about your:  Alcohol use.  Tobacco use.  Drug use.  Emotional well-being.  Home and relationship well-being.  Sexual activity.  Eating habits.  Work and work environment.  Method of birth control.  Menstrual cycle.  Pregnancy history. Screening  You may have the following tests or measurements:  Height, weight, and BMI.  Diabetes screening. This is done by checking your blood sugar (glucose) after you have not eaten for a while (fasting).  Blood pressure.  Lipid and cholesterol levels. These may be checked every 5 years starting at age 20.  Skin check.  Hepatitis C blood test.  Hepatitis B blood test.  Sexually transmitted disease (STD) testing.  BRCA-related cancer screening. This may be done if you have a family history of breast, ovarian, tubal, or peritoneal cancers.  Pelvic exam and Pap test. This may be done every 3 years starting at age 21. Starting at age 30, this may be done  every 5 years if you have a Pap test in combination with an HPV test. Discuss your test results, treatment options, and if necessary, the need for more tests with your health care provider. Vaccines  Your health care provider may recommend certain vaccines, such as:  Influenza vaccine. This is recommended every year.  Tetanus, diphtheria, and acellular pertussis (Tdap, Td) vaccine. You may need a Td booster every 10 years.  Varicella vaccine. You may need this if you have not been vaccinated.  HPV vaccine. If you are 26 or younger, you may need three doses over 6 months.  Measles, mumps, and rubella (MMR) vaccine. You may need at least one dose of MMR. You may also need a second dose.  Pneumococcal 13-valent conjugate (PCV13) vaccine. You may need this if you have certain conditions and were not previously vaccinated.  Pneumococcal polysaccharide (PPSV23) vaccine. You may need one or two doses if you smoke cigarettes or if you have certain conditions.  Meningococcal vaccine. One dose is recommended if you are age 19-21 years and a first-year college student living in a residence hall, or if you have one of several medical conditions. You may also need additional booster doses.  Hepatitis A vaccine. You may need this if you have certain conditions or if you travel or work in places where you may be exposed to hepatitis A.  Hepatitis B vaccine. You may need this if you have certain conditions or if you travel or work in places where you may be exposed to hepatitis B.  Haemophilus influenzae type b (Hib) vaccine. You may need   this if you have certain risk factors. Talk to your health care provider about which screenings and vaccines you need and how often you need them. This information is not intended to replace advice given to you by your health care provider. Make sure you discuss any questions you have with your health care provider. Document Released: 03/12/2001 Document Revised:  10/04/2015 Document Reviewed: 11/15/2014 Elsevier Interactive Patient Education  2017 Elsevier Inc.  

## 2016-01-16 NOTE — Progress Notes (Signed)
   Subjective:     Darlene Leonard is a 25 y.o. female and is here for a comprehensive physical exam. The patient reports no problems.  Social History   Social History  . Marital status: Single    Spouse name: N/A  . Number of children: N/A  . Years of education: N/A   Occupational History  . Not on file.   Social History Main Topics  . Smoking status: Former Smoker    Quit date: 02/06/2014  . Smokeless tobacco: Never Used  . Alcohol use No  . Drug use: No  . Sexual activity: Yes    Partners: Male    Birth control/ protection: None   Other Topics Concern  . Not on file   Social History Narrative  . No narrative on file   Health Maintenance  Topic Date Due  . HEMOGLOBIN A1C  02/25/1989  . PNEUMOCOCCAL POLYSACCHARIDE VACCINE (1) 06/06/1991  . FOOT EXAM  06/06/1999  . OPHTHALMOLOGY EXAM  06/06/1999  . URINE MICROALBUMIN  06/06/1999  . INFLUENZA VACCINE  08/29/2015  . PAP SMEAR  01/28/2017  . TETANUS/TDAP  08/10/2025  . HIV Screening  Completed    The following portions of the patient's history were reviewed and updated as appropriate: allergies, current medications, past family history, past medical history, past social history, past surgical history and problem list.  Review of Systems Pertinent items noted in HPI and remainder of comprehensive ROS otherwise negative.   Objective:    General appearance: alert, cooperative and appears stated age Neck: no adenopathy, no carotid bruit, no JVD, supple, symmetrical, trachea midline and thyroid not enlarged, symmetric, no tenderness/mass/nodules Lungs: clear to auscultation bilaterally Breasts: normal appearance, no masses or tenderness Heart: regular rate and rhythm, S1, S2 normal, no murmur, click, rub or gallop Abdomen: soft, non-tender; bowel sounds normal; no masses,  no organomegaly Pelvic: cervix normal in appearance, external genitalia normal, no adnexal masses or tenderness, no cervical motion tenderness,  rectovaginal septum normal, uterus normal size, shape, and consistency and vagina normal without discharge    Assessment:    Healthy female exam. Needs flu vaccine, hypertension, BC counseling     Plan:  Switched BC to [place] Flu vaccine given RTC 1 year or as needed.  [place] Every Aura CampsShambley, CNM   See After Visit Summary for Counseling Recommendations

## 2016-01-18 ENCOUNTER — Encounter: Payer: Medicaid Other | Admitting: Obstetrics and Gynecology

## 2016-01-18 LAB — CYTOLOGY - PAP

## 2016-11-29 ENCOUNTER — Encounter: Payer: Self-pay | Admitting: Obstetrics and Gynecology

## 2016-12-25 IMAGING — US US OB LIMITED
1 series · 14 of 26 positions shown · non-contrast
Comparison: none

[Series 1: us ob limited · 0.25mm/px · 14 of 26 slices shown]
[im 1/26]
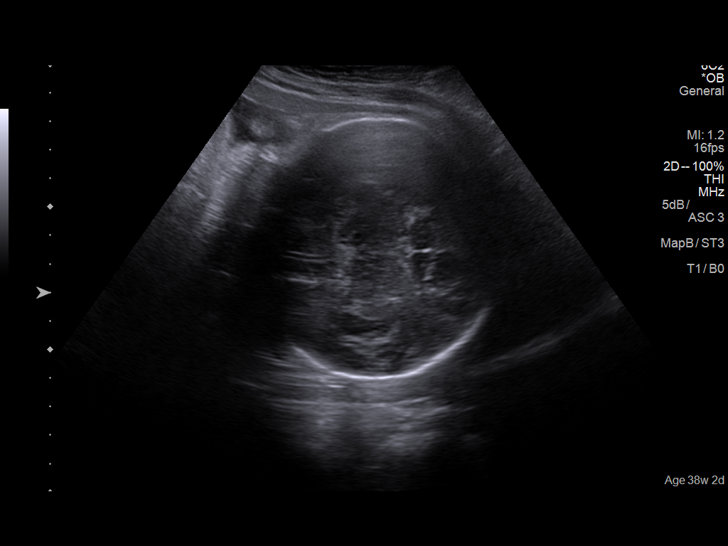
[im 3/26]
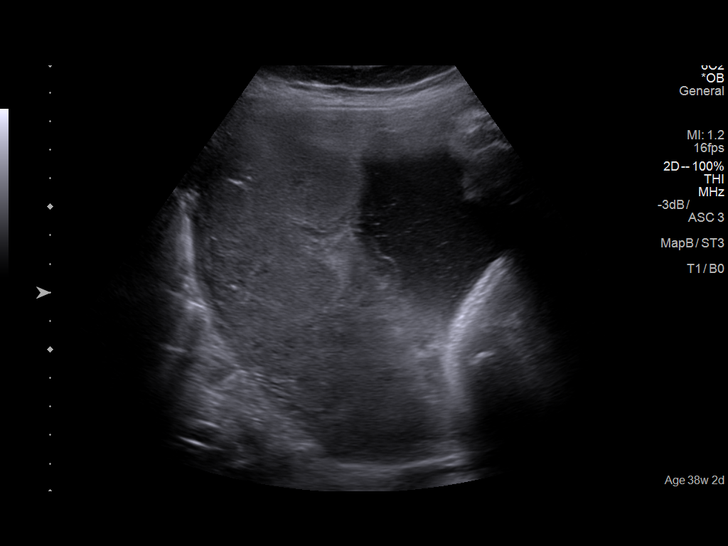
[im 5/26]
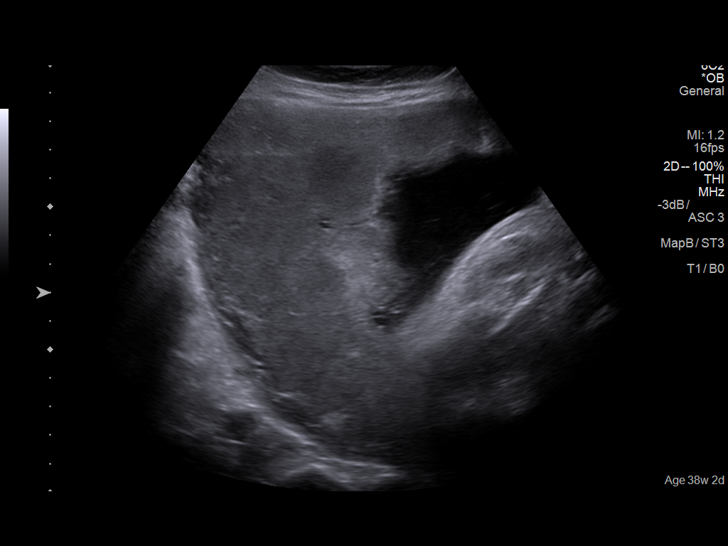
[im 7/26]
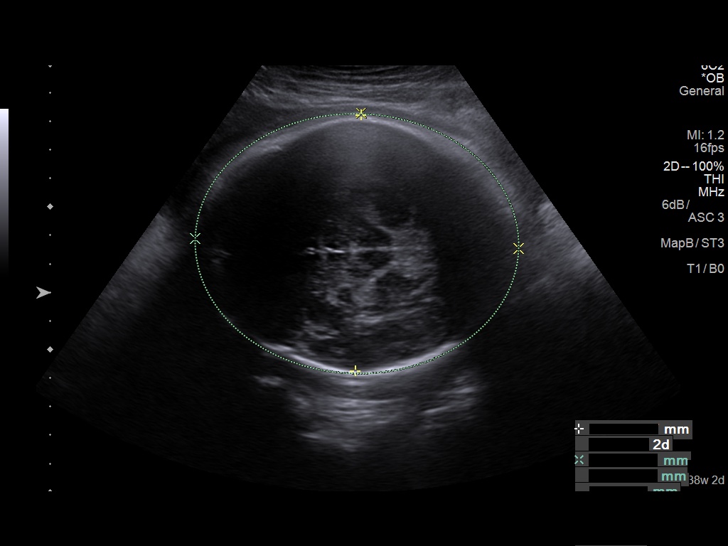
[im 9/26]
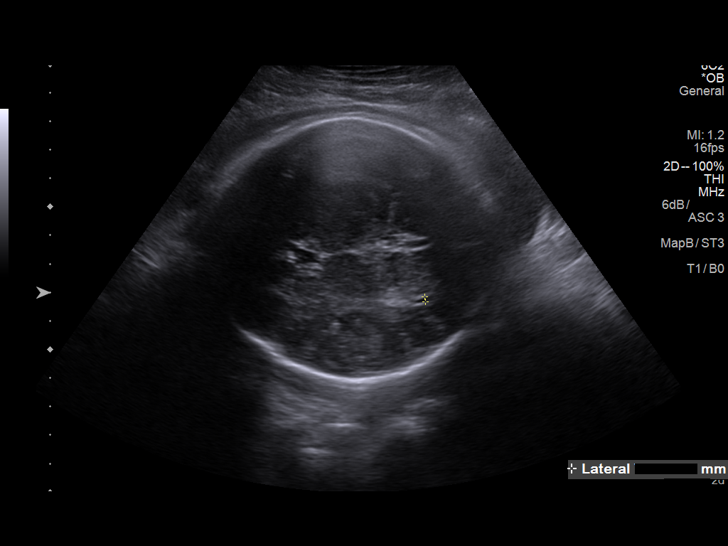
[im 11/26]
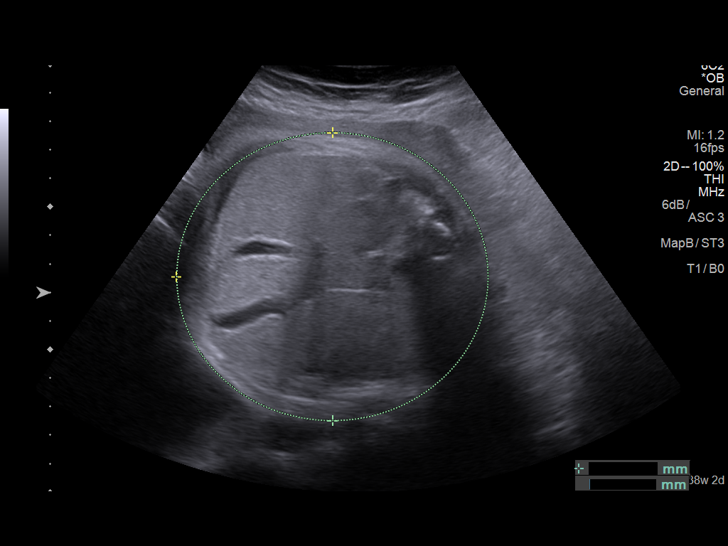
[im 13/26]
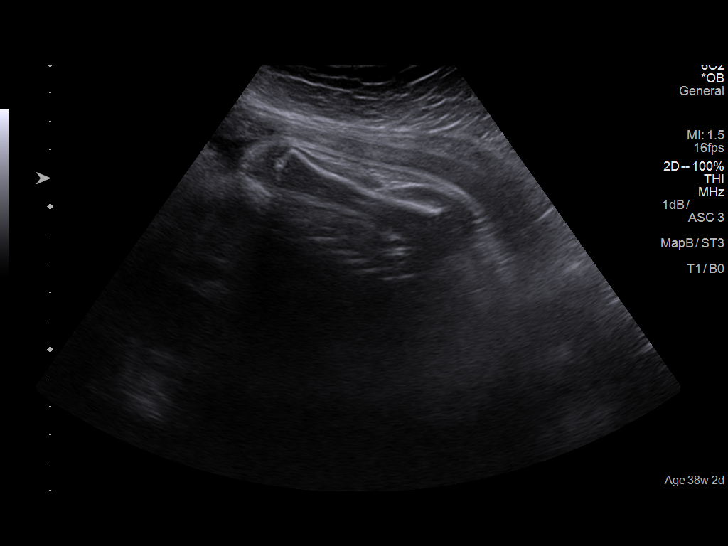
[im 14/26]
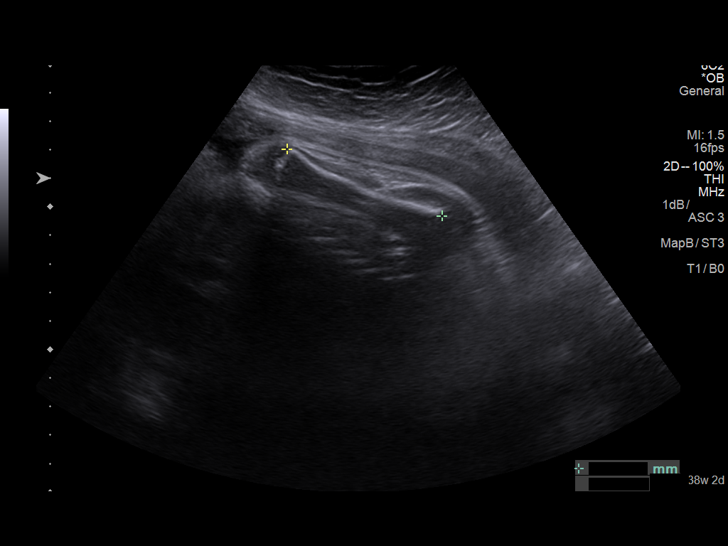
[im 16/26]
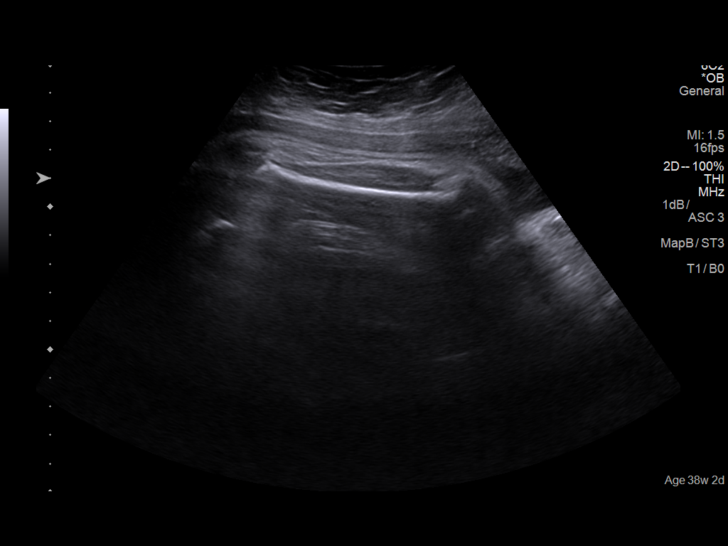
[im 18/26]
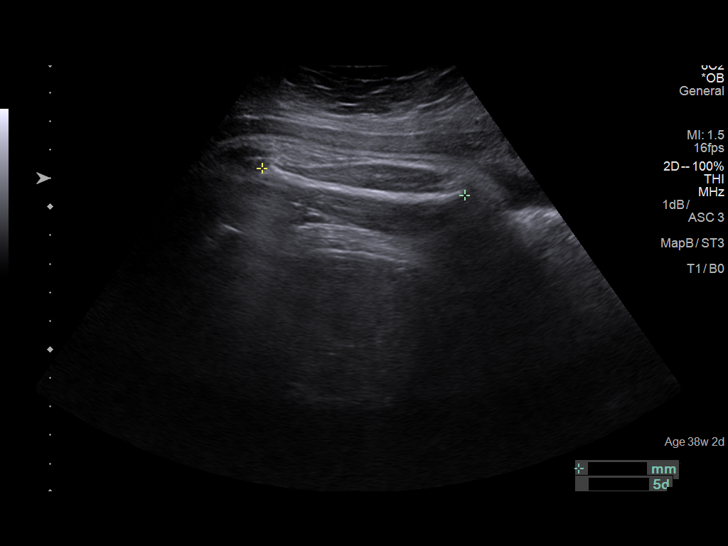
[im 20/26]
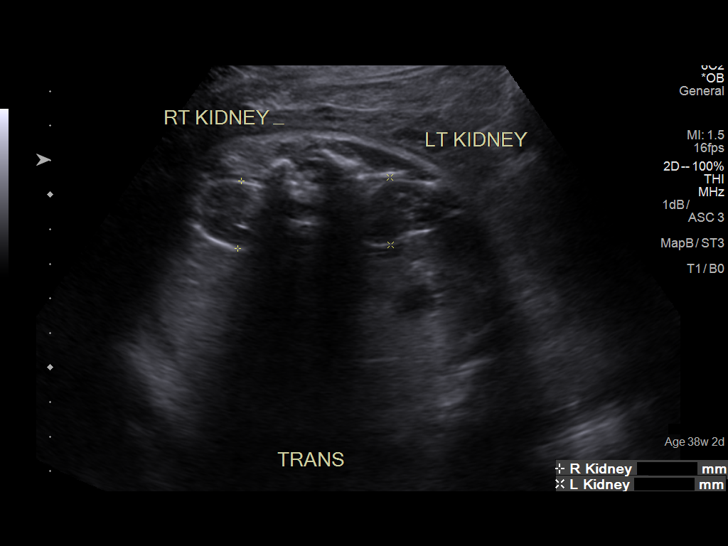
[im 22/26]
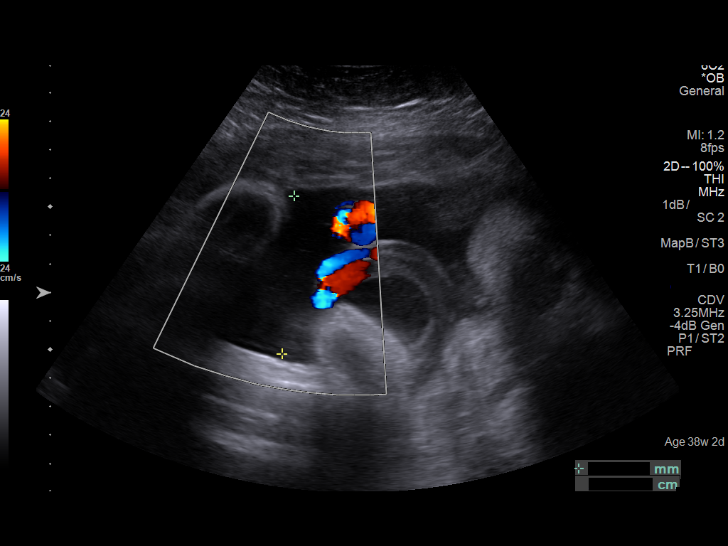
[im 24/26]
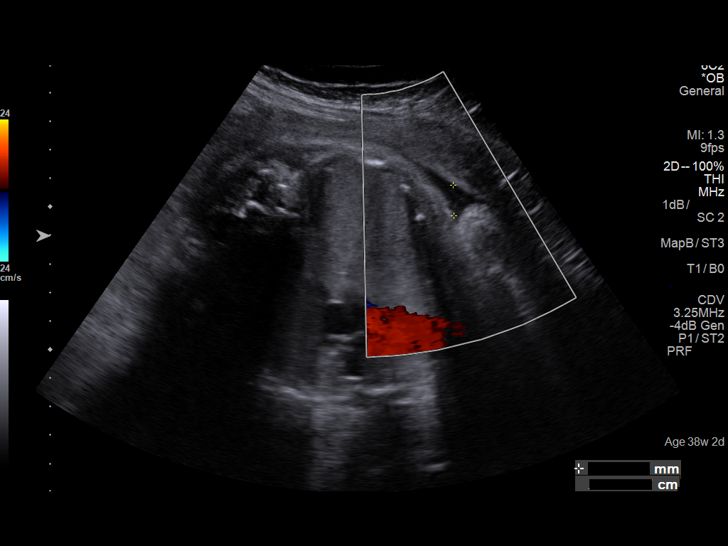
[im 26/26]
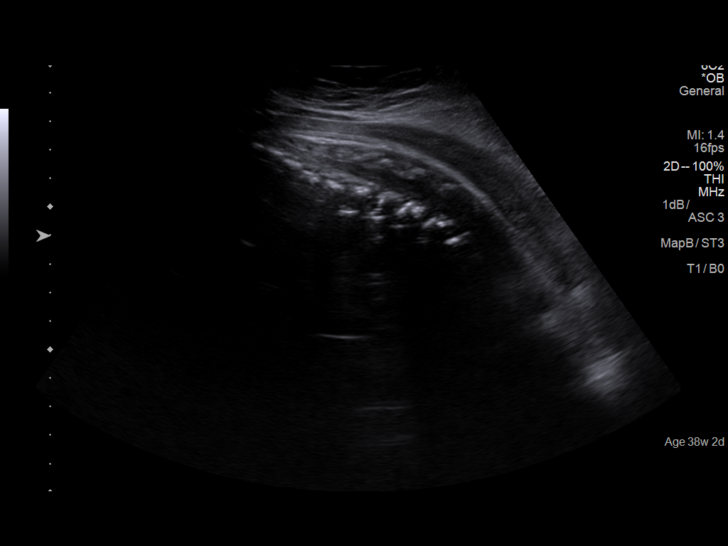

[14 of 26 positions shown; findings below may reference images not displayed]

Canned report from images found in remote index.

Refer to host system for actual result text.

## 2017-01-23 ENCOUNTER — Encounter: Payer: Medicaid Other | Admitting: Obstetrics and Gynecology

## 2020-02-04 DIAGNOSIS — Z7689 Persons encountering health services in other specified circumstances: Secondary | ICD-10-CM | POA: Insufficient documentation

## 2020-02-04 NOTE — Patient Instructions (Signed)
Preventive Care 21-31 Years Old, Female Preventive care refers to visits with your health care provider and lifestyle choices that can promote health and wellness. This includes:  A yearly physical exam. This may also be called an annual well check.  Regular dental visits and eye exams.  Immunizations.  Screening for certain conditions.  Healthy lifestyle choices, such as eating a healthy diet, getting regular exercise, not using drugs or products that contain nicotine and tobacco, and limiting alcohol use. What can I expect for my preventive care visit? Physical exam Your health care provider will check your:  Height and weight. This may be used to calculate body mass index (BMI), which tells if you are at a healthy weight.  Heart rate and blood pressure.  Skin for abnormal spots. Counseling Your health care provider may ask you questions about your:  Alcohol, tobacco, and drug use.  Emotional well-being.  Home and relationship well-being.  Sexual activity.  Eating habits.  Work and work environment.  Method of birth control.  Menstrual cycle.  Pregnancy history. What immunizations do I need?  Influenza (flu) vaccine  This is recommended every year. Tetanus, diphtheria, and pertussis (Tdap) vaccine  You may need a Td booster every 10 years. Varicella (chickenpox) vaccine  You may need this if you have not been vaccinated. Human papillomavirus (HPV) vaccine  If recommended by your health care provider, you may need three doses over 6 months. Measles, mumps, and rubella (MMR) vaccine  You may need at least one dose of MMR. You may also need a second dose. Meningococcal conjugate (MenACWY) vaccine  One dose is recommended if you are age 19-21 years and a first-year college student living in a residence hall, or if you have one of several medical conditions. You may also need additional booster doses. Pneumococcal conjugate (PCV13) vaccine  You may need  this if you have certain conditions and were not previously vaccinated. Pneumococcal polysaccharide (PPSV23) vaccine  You may need one or two doses if you smoke cigarettes or if you have certain conditions. Hepatitis A vaccine  You may need this if you have certain conditions or if you travel or work in places where you may be exposed to hepatitis A. Hepatitis B vaccine  You may need this if you have certain conditions or if you travel or work in places where you may be exposed to hepatitis B. Haemophilus influenzae type b (Hib) vaccine  You may need this if you have certain conditions. You may receive vaccines as individual doses or as more than one vaccine together in one shot (combination vaccines). Talk with your health care provider about the risks and benefits of combination vaccines. What tests do I need?  Blood tests  Lipid and cholesterol levels. These may be checked every 5 years starting at age 20.  Hepatitis C test.  Hepatitis B test. Screening  Diabetes screening. This is done by checking your blood sugar (glucose) after you have not eaten for a while (fasting).  Sexually transmitted disease (STD) testing.  BRCA-related cancer screening. This may be done if you have a family history of breast, ovarian, tubal, or peritoneal cancers.  Pelvic exam and Pap test. This may be done every 3 years starting at age 21. Starting at age 30, this may be done every 5 years if you have a Pap test in combination with an HPV test. Talk with your health care provider about your test results, treatment options, and if necessary, the need for more tests.   Follow these instructions at home: Eating and drinking   Eat a diet that includes fresh fruits and vegetables, whole grains, lean protein, and low-fat dairy.  Take vitamin and mineral supplements as recommended by your health care provider.  Do not drink alcohol if: ? Your health care provider tells you not to drink. ? You are  pregnant, may be pregnant, or are planning to become pregnant.  If you drink alcohol: ? Limit how much you have to 0-1 drink a day. ? Be aware of how much alcohol is in your drink. In the [place] one drink equals one 12 oz bottle of beer (355 mL), one 5 oz glass of wine (148 mL), or one 1 oz glass of hard liquor (44 mL). Lifestyle  Take daily care of your teeth and gums.  Stay active. Exercise for at least 30 minutes on 5 or more days each week.  Do not use any products that contain nicotine or tobacco, such as cigarettes, e-cigarettes, and chewing tobacco. If you need help quitting, ask your health care provider.  If you are sexually active, practice safe sex. Use a condom or other form of birth control (contraception) in order to prevent pregnancy and STIs (sexually transmitted infections). If you plan to become pregnant, see your health care provider for a preconception visit. What's next?  Visit your health care provider once a year for a well check visit.  Ask your health care provider how often you should have your eyes and teeth checked.  Stay up to date on all vaccines. This information is not intended to replace advice given to you by your health care provider. Make sure you discuss any questions you have with your health care provider. Document Revised: 09/25/2017 Document Reviewed: 09/25/2017 Elsevier Patient Education  2020 Elsevier Inc. Breast Self-Awareness Breast self-awareness is knowing how your breasts look and feel. Doing breast self-awareness is important. It allows you to catch a breast problem early while it is still small and can be treated. All women should do breast self-awareness, including women who have had breast implants. Tell your doctor if you notice a change in your breasts. What you need:  A mirror.  A well-lit room. How to do a breast self-exam A breast self-exam is one way to learn what is normal for your breasts and to check for changes. To do a  breast self-exam: Look for changes  1. Take off all the clothes above your waist. 2. Stand in front of a mirror in a room with good lighting. 3. Put your hands on your hips. 4. Push your hands down. 5. Look at your breasts and nipples in the mirror to see if one breast or nipple looks different from the other. Check to see if: ? The shape of one breast is different. ? The size of one breast is different. ? There are wrinkles, dips, and bumps in one breast and not the other. 6. Look at each breast for changes in the skin, such as: ? Redness. ? Scaly areas. 7. Look for changes in your nipples, such as: ? Liquid around the nipples. ? Bleeding. ? Dimpling. ? Redness. ? A change in where the nipples are. Feel for changes  1. Lie on your back on the floor. 2. Feel each breast. To do this, follow these steps: ? Pick a breast to feel. ? Put the arm closest to that breast above your head. ? Use your other arm to feel the nipple area of your breast. Feel   the area with the pads of your three middle fingers by making small circles with your fingers. For the first circle, press lightly. For the second circle, press harder. For the third circle, press even harder. ? Keep making circles with your fingers at the different pressures as you move down your breast. Stop when you feel your ribs. ? Move your fingers a little toward the center of your body. ? Start making circles with your fingers again, this time going up until you reach your collarbone. ? Keep making up-and-down circles until you reach your armpit. Remember to keep using the three pressures. ? Feel the other breast in the same way. 3. Sit or stand in the tub or shower. 4. With soapy water on your skin, feel each breast the same way you did in step 2 when you were lying on the floor. Write down what you find Writing down what you find can help you remember what to tell your doctor. Write down:  What is normal for each breast.  Any  changes you find in each breast, including: ? The kind of changes you find. ? Whether you have pain. ? Size and location of any lumps.  When you last had your menstrual period. General tips  Check your breasts every month.  If you are breastfeeding, the best time to check your breasts is after you feed your baby or after you use a breast pump.  If you get menstrual periods, the best time to check your breasts is 5-7 days after your menstrual period is over.  With time, you will become comfortable with the self-exam, and you will begin to know if there are changes in your breasts. Contact a doctor if you:  See a change in the shape or size of your breasts or nipples.  See a change in the skin of your breast or nipples, such as red or scaly skin.  Have fluid coming from your nipples that is not normal.  Find a lump or thick area that was not there before.  Have pain in your breasts.  Have any concerns about your breast health. Summary  Breast self-awareness includes looking for changes in your breasts, as well as feeling for changes within your breasts.  Breast self-awareness should be done in front of a mirror in a well-lit room.  You should check your breasts every month. If you get menstrual periods, the best time to check your breasts is 5-7 days after your menstrual period is over.  Let your doctor know of any changes you see in your breasts, including changes in size, changes on the skin, pain or tenderness, or fluid from your nipples that is not normal. This information is not intended to replace advice given to you by your health care provider. Make sure you discuss any questions you have with your health care provider. Document Revised: 09/02/2017 Document Reviewed: 09/02/2017 Elsevier Patient Education  2020 Elsevier Inc.  

## 2020-02-08 ENCOUNTER — Telehealth: Payer: Self-pay

## 2020-02-08 ENCOUNTER — Other Ambulatory Visit: Payer: Self-pay

## 2020-02-08 ENCOUNTER — Encounter: Payer: Self-pay | Admitting: Obstetrics and Gynecology

## 2020-02-08 ENCOUNTER — Other Ambulatory Visit (HOSPITAL_COMMUNITY)
Admission: RE | Admit: 2020-02-08 | Discharge: 2020-02-08 | Disposition: A | Discharge status: Home / Self Care | Payer: Commercial Managed Care - PPO | Source: Ambulatory Visit | Attending: Obstetrics and Gynecology | Admitting: Obstetrics and Gynecology

## 2020-02-08 ENCOUNTER — Ambulatory Visit (INDEPENDENT_AMBULATORY_CARE_PROVIDER_SITE_OTHER): Payer: Commercial Managed Care - PPO | Admitting: Obstetrics and Gynecology

## 2020-02-08 VITALS — BP 125/88 | HR 92 | Ht 63.0 in | Wt 131.8 lb

## 2020-02-08 DIAGNOSIS — Z113 Encounter for screening for infections with a predominantly sexual mode of transmission: Secondary | ICD-10-CM | POA: Insufficient documentation

## 2020-02-08 DIAGNOSIS — Z01419 Encounter for gynecological examination (general) (routine) without abnormal findings: Secondary | ICD-10-CM

## 2020-02-08 DIAGNOSIS — Z7689 Persons encountering health services in other specified circumstances: Secondary | ICD-10-CM | POA: Diagnosis not present

## 2020-02-08 DIAGNOSIS — Z124 Encounter for screening for malignant neoplasm of cervix: Secondary | ICD-10-CM

## 2020-02-08 DIAGNOSIS — R718 Other abnormality of red blood cells: Secondary | ICD-10-CM

## 2020-02-08 DIAGNOSIS — Z30011 Encounter for initial prescription of contraceptive pills: Secondary | ICD-10-CM

## 2020-02-08 DIAGNOSIS — F419 Anxiety disorder, unspecified: Secondary | ICD-10-CM

## 2020-02-08 MED ORDER — HYDROXYZINE HCL 25 MG PO TABS: 25.0000 mg | ORAL_TABLET | Freq: Four times a day (QID) | ORAL | 2 refills | Status: DC | PRN

## 2020-02-08 MED ORDER — SERTRALINE HCL 50 MG PO TABS: 50.0000 mg | ORAL_TABLET | Freq: Every day | ORAL | 3 refills | Status: DC

## 2020-02-08 NOTE — Telephone Encounter (Signed)
Pt was seen today and stated that her meds have not been filled. The pt is using [facility] in [place]. Please advise

## 2020-02-08 NOTE — Telephone Encounter (Signed)
Pt called asking about her medication from her visit. Please advise. Thanks [employer]

## 2020-02-08 NOTE — Progress Notes (Signed)
Pt present for annual exam and STD screening. Pt stated that she was having headaches in the back of the head, anxiety, body shaking and waking up in the middle of the night with anxiety.  PHQ-9=9. GAD-7= 16.

## 2020-02-08 NOTE — Progress Notes (Signed)
Physician Attestation:   I have seen and examined the patient with Lauren Higgins, Student-MidWife.  I have reviewed the record and concur with patient management and plan.   Arvind Mexicano, MD [facility]   

## 2020-02-08 NOTE — Telephone Encounter (Signed)
Please let her know meds have been sent in.

## 2020-02-08 NOTE — Progress Notes (Signed)
ANNUAL PREVENTATIVE CARE GYN  ENCOUNTER NOTE  Subjective:       Darlene Leonard is a 30 y.o. [phone] female here for a routine annual gynecologic exam.  Current complaints: 1.  Increased anxiety, states 'Husband cheated and left me.' Reports neck/ back of head pain associated 2. STI testing desired.  3. Birth control - desires to initiate.    GAD 7 : Generalized Anxiety Score 02/08/2020 02/08/2020  Nervous, Anxious, on Edge 3 3  Control/stop worrying 2 2  Worry too much - different things 2 2  Trouble relaxing 2 2  Restless 3 3  Easily annoyed or irritable 2 2  Afraid - awful might happen 2 2  Total GAD 7 Score 16 16  Anxiety Difficulty Very difficult Very difficult    Depression screen [facility] 2/9 02/08/2020 09/26/2015 11/11/2014 08/11/2014  Decreased Interest 1 0 0 0  Down, Depressed, Hopeless 1 0 0 0  PHQ - 2 Score 2 0 0 0  Altered sleeping 2 0 - -  Tired, decreased energy 1 0 - -  Change in appetite 1 0 - -  Feeling bad or failure about yourself  1 0 - -  Trouble concentrating 2 0 - -  Moving slowly or fidgety/restless 0 0 - -  Suicidal thoughts 0 0 - -  PHQ-9 Score 9 0 - -  Difficult doing work/chores Not difficult at all - - -    Denies difficulty breathing, respiratory distress, chest pain, constipation, and leg swelling or pain.  Gynecologic History Patient's last menstrual period was 01/26/2020. Contraception: none, husband had vasectomy  Last Pap:01/16/2016. Results were: normal   Obstetric History OB History  Gravida Para Term Preterm AB Living  3 2 2   1 2   SAB IAB Ectopic Multiple Live Births  1     0 2    # Outcome Date GA Lbr Len/2nd Weight Sex Delivery Anes PTL Lv  3 Term 08/10/15 [redacted]w[redacted]d / 00:12 2700 g F Vag-Spont None  LIV  2 Term 09/29/14 [redacted]w[redacted]d  2790 g M Vag-Spont   LIV  1 SAB 06/12/09 [redacted]w[redacted]d   U    FD     Complications: Status post D&C    Obstetric Comments  D&C 06/20/2013    Past Medical History:  Diagnosis Date  . Medical history  non-contributory     Past Surgical History:  Procedure Laterality Date  . APPENDECTOMY  2011    No current outpatient medications on file prior to visit.   No current facility-administered medications on file prior to visit.    No Known Allergies  Social History   Socioeconomic History  . Marital status: Single    Spouse name: Not on file  . Number of children: Not on file  . Years of education: Not on file  . Highest education level: Not on file  Occupational History  . Not on file  Tobacco Use  . Smoking status: Current Some Day Smoker    Last attempt to quit: 02/06/2014    Years since quitting: 6.0  . Smokeless tobacco: Never Used  Vaping Use  . Vaping Use: Never used  Substance and Sexual Activity  . Alcohol use: Yes    Alcohol/week: 3.0 - 4.0 standard drinks    Types: 3 - 4 Glasses of wine per week    Comment: occas  . Drug use: No  . Sexual activity: Not Currently    Partners: Male    Birth control/protection: None  Other Topics  Concern  . Not on file  Social History Narrative  . Not on file   Social Determinants of Health   Financial Resource Strain: Not on file  Food Insecurity: Not on file  Transportation Needs: Not on file  Physical Activity: Not on file  Stress: Not on file  Social Connections: Not on file  Intimate Partner Violence: Not on file    Family History  Problem Relation Age of Onset  . Breast cancer Maternal Aunt   . Heart disease Mother   . Heart disease Paternal Grandmother   . Heart disease Maternal Grandmother   . Diabetes Father   . Diabetes Paternal Uncle     The following portions of the patient's history were reviewed and updated as appropriate: allergies, current medications, past family history, past medical history, past social history, past surgical history and problem list.  Review of Systems  ROS- Negative other than what was reported above. Information obtained verbally from patient.   Objective:   BP  125/88   Pulse 92   Ht 5\' 3"  (1.6 m)   Wt 59.8 kg   LMP 01/26/2020   BMI 23.35 kg/m    CONSTITUTIONAL: Well-developed, well-nourished female in no acute distress.   PSYCHIATRIC: Normal mood and affect. Normal behavior. Normal judgment and thought content.  NEUROLGIC: Alert and oriented to person, place, and time. Normal muscle tone coordination. No cranial nerve deficit noted.   EYES: Conjunctivae and EOM are normal. Pupils are equal, round, and reactive to light. No scleral icterus.   NECK: Normal range of motion, supple, no masses.  Normal thyroid.   SKIN: Skin is warm and dry. No rash noted. Not diaphoretic. No erythema. No pallor.  CARDIOVASCULAR: Normal heart rate noted, regular rhythm, no murmur.  RESPIRATORY: Clear to auscultation bilaterally. Effort and breath sounds normal, no problems with respiration noted.  BREASTS: Symmetric in size. No masses, skin changes, nipple drainage, or lymphadenopathy.  ABDOMEN: Soft, normal bowel sounds, no distention noted.  No tenderness, rebound or guarding.   PELVIC:  External Genitalia: Normal  BUS: Normal  Vagina: Normal  Cervix: Normal  Uterus: Normal  Adnexa: Normal   MUSCULOSKELETAL: Normal range of motion. No tenderness.  No cyanosis, clubbing, or edema.    Assessment:   1. Encounter to establish care with new doctor   2. Well woman exam with routine gynecological exam   3. Screen for STD (sexually transmitted disease)   4. Pap smear for cervical cancer screening   5. Encounter for initial prescription of contraceptive pills     Plan:  Pap: Pap Co Test and GC/CT NAAT  Labs: See orders Routine preventative health maintenance measures emphasized: Exercise/Diet/Weight control, Tobacco Warnings, Alcohol/Substance use risks, Stress Management and Peer Pressure Issues Rx birth control (given sample of Lo-Loestrin, and will prescribe), Vistaril and Zoloft, see orders Encouraged counseling.   Reviewed red flag symptoms  and when to call. RTC x 1 year for annual exam or sooner if needed. To follow up in 1 month for mood check.    01/28/2020, Student-MidWife  [employer] 02/08/20 12:00 PM

## 2020-02-09 ENCOUNTER — Telehealth: Payer: Self-pay

## 2020-02-09 LAB — COMPREHENSIVE METABOLIC PANEL
ALT: 14 IU/L (ref 0–32)
AST: 15 IU/L (ref 0–40)
Albumin/Globulin Ratio: 1.7 (ref 1.2–2.2)
Albumin: 4.5 g/dL (ref 3.9–5.0)
Alkaline Phosphatase: 69 IU/L (ref 44–121)
BUN/Creatinine Ratio: 10 (ref 9–23)
BUN: 7 mg/dL (ref 6–20)
Bilirubin Total: 0.3 mg/dL (ref 0.0–1.2)
CO2: 24 mmol/L (ref 20–29)
Calcium: 9.7 mg/dL (ref 8.7–10.2)
Chloride: 102 mmol/L (ref 96–106)
Creatinine, Ser: 0.68 mg/dL (ref 0.57–1.00)
GFR calc Af Amer: 136 mL/min/{1.73_m2} (ref >59)
GFR calc non Af Amer: 118 mL/min/{1.73_m2} (ref >59)
Globulin, Total: 2.6 g/dL (ref 1.5–4.5)
Glucose: 83 mg/dL (ref 65–99)
Potassium: 4.5 mmol/L (ref 3.5–5.2)
Sodium: 139 mmol/L (ref 134–144)
Total Protein: 7.1 g/dL (ref 6.0–8.5)

## 2020-02-09 LAB — CBC
Hematocrit: 41.2 % (ref 34.0–46.6)
Hemoglobin: 14 g/dL (ref 11.1–15.9)
MCH: 34.2 pg — ABNORMAL HIGH (ref 26.6–33.0)
MCHC: 34 g/dL (ref 31.5–35.7)
MCV: 101 fL — ABNORMAL HIGH (ref 79–97)
Platelets: 229 10*3/uL (ref 150–450)
RBC: 4.09 x10E6/uL (ref 3.77–5.28)
RDW: 11.8 % (ref 11.7–15.4)
WBC: 5.5 10*3/uL (ref 3.4–10.8)

## 2020-02-09 LAB — HIV ANTIBODY (ROUTINE TESTING W REFLEX): HIV Screen 4th Generation wRfx: NONREACTIVE

## 2020-02-09 LAB — HEPATITIS C ANTIBODY: Hep C Virus Ab: <0.1 s/co ratio (ref 0.0–0.9)

## 2020-02-09 LAB — TSH: TSH: 1.89 u[IU]/mL (ref 0.450–4.500)

## 2020-02-09 LAB — RPR: RPR Ser Ql: NONREACTIVE

## 2020-02-09 LAB — HEPATITIS B SURFACE ANTIBODY,QUALITATIVE: Hep B Surface Ab, Qual: NONREACTIVE

## 2020-02-09 MED ORDER — NORETHIN-ETH ESTRAD-FE BIPHAS 1 MG-10 MCG / 10 MCG PO TABS: 1.0000 | ORAL_TABLET | Freq: Every day | ORAL | 3 refills | Status: DC

## 2020-02-09 NOTE — Telephone Encounter (Signed)
Hello Dr. Valentino Saxon, The pt is stating that she was given a rx for birth control I only see 2 medications from yesterday. Please advise. Thanks [employer]

## 2020-02-09 NOTE — Telephone Encounter (Signed)
Pt is aware that her medication has been sent to the pharmacy. Pt stated that she was never given samples of the birth pills. Please advise on which birth control pills to be given.

## 2020-02-09 NOTE — Telephone Encounter (Signed)
Please advise. Thanks Erion Weightman 

## 2020-02-09 NOTE — Addendum Note (Signed)
Addended by: Fabian November on: 02/09/2020 05:26 PM   Modules accepted: Orders

## 2020-02-09 NOTE — Telephone Encounter (Signed)
Called patient to get her scheduled for 1 month televisit for anxiety per [facility]. Patient stated that she was supposed to have 3 Rx's called into her pharmacy two of them being for anxiety and the other was supposed to be her birth control, patients pharmacy is stating that they dont have anything for her. Verified the pharmacy with this patient she the one we have on file is correct the [facility] on [address]. Could you please advise?

## 2020-02-09 NOTE — Telephone Encounter (Signed)
Lo-Loestrin sent to patient's pharmacy on file.   Dr. Valentino Saxon

## 2020-02-09 NOTE — Telephone Encounter (Signed)
I gave her a sample to start which will last her for the first month  If I prescribe it and she doesn't pick it up within 30 days, then they put it back and she needs a new prescription. If she plans to pick it up within 30 days I'll go ahead and prescribe it. If she wanted to wait until she was on the placebo pills of the sample pack and let us know, then I could send in a prescription at that time.

## 2020-02-10 NOTE — Telephone Encounter (Signed)
Spoke to pt and she is aware that [facility] forgot to give her samples of Lo-loestrin and sent her a rx to her pharmacy. Pt voiced that she understood and will pick up the medication from the pharmacy.

## 2020-02-10 NOTE — Addendum Note (Signed)
Addended by: Fabian November on: 02/10/2020 03:27 PM   Modules accepted: Orders

## 2020-02-17 LAB — CYTOLOGY - PAP
Chlamydia: NEGATIVE
Comment: NEGATIVE
Comment: NEGATIVE
Comment: NORMAL
Diagnosis: NEGATIVE
Diagnosis: REACTIVE
High risk HPV: NEGATIVE
Neisseria Gonorrhea: NEGATIVE

## 2020-03-08 ENCOUNTER — Other Ambulatory Visit: Payer: Self-pay

## 2020-03-08 ENCOUNTER — Ambulatory Visit (INDEPENDENT_AMBULATORY_CARE_PROVIDER_SITE_OTHER): Payer: Commercial Managed Care - PPO | Admitting: Obstetrics and Gynecology

## 2020-03-08 ENCOUNTER — Encounter: Payer: Self-pay | Admitting: Obstetrics and Gynecology

## 2020-03-08 VITALS — Ht 63.0 in

## 2020-03-08 DIAGNOSIS — F419 Anxiety disorder, unspecified: Secondary | ICD-10-CM | POA: Diagnosis not present

## 2020-03-08 NOTE — Progress Notes (Signed)
Virtual Visit via Telephone Note  I connected with Darlene Leonard on 03/08/20 at  4:15 PM EST by telephone and verified that I am speaking with the correct person using two identifiers.  Location: Patient: Home Provider: Office   I discussed the limitations, risks, security and privacy concerns of performing an evaluation and management service by telephone and the availability of in person appointments. I also discussed with the patient that there may be a patient responsible charge related to this service. The patient expressed understanding and agreed to proceed.   History of Present Illness: 30 y.o. G8P2012 female who presents for 1 month f/u of mood symptoms (anxiety). Currently going through a separation/divorce. [name] was initiated on Zoloft 50 mg last visit. She was also given prescription for Vistaril. Patient notes that she is noting a small difference in her symptoms.    Observations/Objective: Height 5\' 3"  (1.6 m), last menstrual period 02/23/2020. Unsure of current weight.    GAD 7 : Generalized Anxiety Score 03/08/2020 02/08/2020 02/08/2020  Nervous, Anxious, on Edge 3 3 3   Control/stop worrying 2 2 2   Worry too much - different things 2 2 2   Trouble relaxing 3 2 2   Restless 3 3 3   Easily annoyed or irritable 2 2 2   Afraid - awful might happen 2 2 2   Total GAD 7 Score 17 16 16   Anxiety Difficulty Not difficult at all Very difficult Very difficult   .  Assessment and Plan: - Will increase Zoloft to 75 mg daily. If no change in symptoms in 2 weeks, increase to 100 mg.   - Can increase Vistaril from 25 mg to 50 mg   Follow Up Instructions: Follow up in 1 month for mood check.    I discussed the assessment and treatment plan with the patient. The patient was provided an opportunity to ask questions and all were answered. The patient agreed with the plan and demonstrated an understanding of the instructions.   The patient was advised to call back or seek an in-person  evaluation if the symptoms worsen or if the condition fails to improve as anticipated.  I provided 7 minutes of non-face-to-face time during this encounter.   04/07/2020, MD  [facility]

## 2020-03-09 ENCOUNTER — Encounter: Payer: Self-pay | Admitting: Obstetrics and Gynecology

## 2020-03-31 ENCOUNTER — Other Ambulatory Visit: Payer: Self-pay

## 2020-03-31 ENCOUNTER — Telehealth: Payer: Self-pay | Admitting: Obstetrics and Gynecology

## 2020-03-31 MED ORDER — NORETHIN-ETH ESTRAD-FE BIPHAS 1 MG-10 MCG / 10 MCG PO TABS: 1.0000 | ORAL_TABLET | Freq: Every day | ORAL | 0 refills | Status: DC

## 2020-03-31 MED ORDER — SERTRALINE HCL 50 MG PO TABS: 75.0000 mg | ORAL_TABLET | Freq: Every day | ORAL | 3 refills | Status: AC

## 2020-03-31 MED ORDER — NORETHIN-ETH ESTRAD-FE BIPHAS 1 MG-10 MCG / 10 MCG PO TABS: 1.0000 | ORAL_TABLET | Freq: Every day | ORAL | 3 refills | Status: DC

## 2020-03-31 NOTE — Telephone Encounter (Signed)
Please advise on the increase of the Zoloft. I will refill birth control until her visit. Thanks [employer]

## 2020-03-31 NOTE — Telephone Encounter (Signed)
Prescriptions sent in

## 2020-03-31 NOTE — Telephone Encounter (Signed)
New message   1. Which medications need to be refilled? (please list name of each medication and dose if known) Sertraline 50 mg working well needs to be increase to  75 mg was advise by MD to give an update  & birth control    2. Which pharmacy/location (including street and city if local pharmacy) is medication to be sent to? [facility] in [place] [address]

## 2020-04-06 ENCOUNTER — Ambulatory Visit (INDEPENDENT_AMBULATORY_CARE_PROVIDER_SITE_OTHER): Payer: Commercial Managed Care - PPO | Admitting: Obstetrics and Gynecology

## 2020-04-06 ENCOUNTER — Encounter: Payer: Self-pay | Admitting: Obstetrics and Gynecology

## 2020-04-06 ENCOUNTER — Other Ambulatory Visit: Payer: Self-pay

## 2020-04-06 VITALS — Ht 63.0 in

## 2020-04-06 DIAGNOSIS — F419 Anxiety disorder, unspecified: Secondary | ICD-10-CM

## 2020-04-06 NOTE — Progress Notes (Signed)
Televist for 1 month follow up for anxiety. Pt was unable to complete bp or wt.  Pt stated that she was doing a little better. GAD-7=12.

## 2020-04-06 NOTE — Progress Notes (Signed)
Virtual Visit via Telephone Note  I connected with Darlene Leonard on 04/06/20 at  1:45 PM EST by telephone and verified that I am speaking with the correct person using two identifiers.  Location: Patient: Home Provider: Office   I discussed the limitations, risks, security and privacy concerns of performing an evaluation and management service by telephone and the availability of in person appointments. I also discussed with the patient that there may be a patient responsible charge related to this service. The patient expressed understanding and agreed to proceed.   History of Present Illness: 30 y.o. G26P2012 female who presents for 1 month f/u of mood symptoms (anxiety). She has been on Zoloft and Vistaril now for 2 months.  Dosing of Zoloft was increased from 50 mg to 75 mg last visit. Vistaril dosing was increased from 25 mg to 50 mg. Notes that overall she feels that she is doing much better.    Additionally, patient notes an issue with her birth control and insurance. States that the cost of her Lo-Loestrin increased to over $100 when she attempted to pick it up from the pharmacy.   Observations/Objective: Height 5\' 3"  (1.6 m), last menstrual period 03/17/2020. Unsure of current weight.    GAD 7 : Generalized Anxiety Score 04/06/2020 03/08/2020 02/08/2020 02/08/2020  Nervous, Anxious, on Edge 2 3 3 3   Control/stop worrying 2 2 2 2   Worry too much - different things 3 2 2 2   Trouble relaxing 1 3 2 2   Restless 2 3 3 3   Easily annoyed or irritable 1 2 2 2   Afraid - awful might happen 1 2 2 2   Total GAD 7 Score 12 17 16 16   Anxiety Difficulty Not difficult at all Not difficult at all Very difficult Very difficult     Assessment and Plan: - Continue Zoloft at 75 mg daily. - Can increase Vistaril from 25 mg to 50 mg  - Will give samples of Lo-Loestrin, and attempt to contact pharmacy to see if there are any issues with coverage and use of coupon card. If still expensive, will  change to a different OCP.   Follow Up Instructions: Follow up in 4 months for mood check.    I discussed the assessment and treatment plan with the patient. The patient was provided an opportunity to ask questions and all were answered. The patient agreed with the plan and demonstrated an understanding of the instructions.   The patient was advised to call back or seek an in-person evaluation if the symptoms worsen or if the condition fails to improve as anticipated.  I provided 8 minutes of non-face-to-face time during this encounter.   04/07/2020, MD  [facility]

## 2021-12-28 DIAGNOSIS — Z419 Encounter for procedure for purposes other than remedying health state, unspecified: Secondary | ICD-10-CM | POA: Diagnosis not present

## 2022-01-28 DIAGNOSIS — Z419 Encounter for procedure for purposes other than remedying health state, unspecified: Secondary | ICD-10-CM | POA: Diagnosis not present

## 2022-02-28 DIAGNOSIS — Z419 Encounter for procedure for purposes other than remedying health state, unspecified: Secondary | ICD-10-CM | POA: Diagnosis not present

## 2022-03-29 DIAGNOSIS — Z419 Encounter for procedure for purposes other than remedying health state, unspecified: Secondary | ICD-10-CM | POA: Diagnosis not present

## 2022-06-03 ENCOUNTER — Telehealth: Payer: Self-pay

## 2022-06-03 NOTE — Telephone Encounter (Signed)
Sending mychart msg. AS, CMA 

## 2022-10-17 ENCOUNTER — Encounter: Payer: Self-pay | Admitting: Obstetrics and Gynecology

## 2022-10-17 ENCOUNTER — Ambulatory Visit (INDEPENDENT_AMBULATORY_CARE_PROVIDER_SITE_OTHER): Payer: Medicaid Other

## 2022-10-17 VITALS — BP 144/92 | HR 105 | Ht 63.0 in | Wt 147.3 lb

## 2022-10-17 DIAGNOSIS — Z3201 Encounter for pregnancy test, result positive: Secondary | ICD-10-CM

## 2022-10-17 DIAGNOSIS — N912 Amenorrhea, unspecified: Secondary | ICD-10-CM

## 2022-10-17 DIAGNOSIS — Z3200 Encounter for pregnancy test, result unknown: Secondary | ICD-10-CM

## 2022-10-17 LAB — POCT URINE PREGNANCY: Preg Test, Ur: POSITIVE — AB

## 2022-10-17 NOTE — Progress Notes (Signed)
NURSE VISIT NOTE  Subjective:    Patient ID: Darlene Leonard, female    DOB: November 02, 1990, 32 y.o.   MRN: [id number]  HPI  Patient is a 32 y.o. [id number] female who presents for evaluation of amenorrhea. She believes she could be pregnant.  Pregnancy was not planned, still deciding on her next steps regarding this pregnancy.  Sexual Activity: single partner, contraception: none. Current symptoms also include: breast tenderness and positive home pregnancy test. Last period was normal.    Objective:    BP (!) 144/92   Pulse (!) 105   Ht 5\' 3"  (1.6 m)   Wt 147 lb 4.8 oz (66.8 kg)   LMP 09/07/2022   BMI 26.09 kg/m   Lab Review  Results for orders placed or performed in visit on 10/17/22  POCT urine pregnancy  Result Value Ref Range   Preg Test, Ur Positive (A) Negative    Assessment:   1. Possible pregnancy, not yet confirmed     Plan:   Pregnancy Test: Positive  Estimated Date of Delivery: 06/14/23 BP Cuff Measurement taken. Cuff Size Adult Encouraged well-balanced diet, plenty of rest when needed, pre-natal vitamins daily and walking for exercise.  Discussed self-help for nausea, avoiding OTC medications until consulting provider or pharmacist, other than Tylenol as needed, minimal caffeine (1-2 cups daily) and avoiding alcohol.   She will schedule her nurse visit @ 7-[redacted] wks pregnant, u/s for dating @10  wk, and NOB visit at [redacted] wk pregnant.    Feel free to call with any questions.      Loman Chroman, CMA

## 2022-10-17 NOTE — Patient Instructions (Addendum)
First Trimester of Pregnancy  The first trimester of pregnancy starts on the first day of your last menstrual period until the end of week 12. This is also called months 1 through 3 of pregnancy. Body changes during your first trimester Your body goes through many changes during pregnancy. The changes usually return to normal after your baby is born. Physical changes You may gain or lose weight. Your breasts may grow larger and hurt. The area around your nipples may get darker. Dark spots or blotches may develop on your face. You may have changes in your hair. Health changes You may feel like you might vomit (nauseous), and you may vomit. You may have heartburn. You may have headaches. You may have trouble pooping (constipation). Your gums may bleed. Other changes You may get tired easily. You may pee (urinate) more often. Your menstrual periods will stop. You may not feel hungry. You may want to eat certain kinds of food. You may have changes in your emotions from day to day. You may have more dreams. Follow these instructions at home: Medicines Take over-the-counter and prescription medicines only as told by your doctor. Some medicines are not safe during pregnancy. Take a prenatal vitamin that contains at least 600 micrograms (mcg) of folic acid. Eating and drinking Eat healthy meals that include: Fresh fruits and vegetables. Whole grains. Good sources of protein, such as meat, eggs, or tofu. Low-fat dairy products. Avoid raw meat and unpasteurized juice, milk, and cheese. If you feel like you may vomit, or you vomit: Eat 4 or 5 small meals a day instead of 3 large meals. Try eating a few soda crackers. Drink liquids between meals instead of during meals. You may need to take these actions to prevent or treat trouble pooping: Drink enough fluids to keep your pee (urine) pale yellow. Eat foods that are high in fiber. These include beans, whole grains, and fresh fruits and  vegetables. Limit foods that are high in fat and sugar. These include fried or sweet foods. Activity Exercise only as told by your doctor. Most people can do their usual exercise routine during pregnancy. Stop exercising if you have cramps or pain in your lower belly (abdomen) or low back. Do not exercise if it is too hot or too humid, or if you are in a place of great height (high altitude). Avoid heavy lifting. If you choose to, you may have sex unless your doctor tells you not to. Relieving pain and discomfort Wear a good support bra if your breasts are sore. Rest with your legs raised (elevated) if you have leg cramps or low back pain. If you have bulging veins (varicose veins) in your legs: Wear support hose as told by your doctor. Raise your feet for 15 minutes, 3-4 times a day. Limit salt in your food. Safety Wear your seat belt at all times when you are in a car. Talk with your doctor if someone is hurting you or yelling at you. Talk with your doctor if you are feeling sad or have thoughts of hurting yourself. Lifestyle Do not use hot tubs, steam rooms, or saunas. Do not douche. Do not use tampons or scented sanitary pads. Do not use herbal medicines, illegal drugs, or medicines that are not approved by your doctor. Do not drink alcohol. Do not smoke or use any products that contain nicotine or tobacco. If you need help quitting, ask your doctor. Avoid cat litter boxes and soil that is used by cats. These carry  germs that can cause harm to the baby and can cause a loss of your baby by miscarriage or stillbirth. General instructions Keep all follow-up visits. This is important. Ask for help if you need counseling or if you need help with nutrition. Your doctor can give you advice or tell you where to go for help. Visit your dentist. At home, brush your teeth with a soft toothbrush. Floss gently. Write down your questions. Take them to your prenatal visits. Where to find more  information American Pregnancy Association: americanpregnancy.org [employer]: www.acog.org Office on Women's Health: [employer] Contact a doctor if: You are dizzy. You have a fever. You have mild cramps or pressure in your lower belly. You have a nagging pain in your belly area. You continue to feel like you may vomit, you vomit, or you have watery poop (diarrhea) for 24 hours or longer. You have a bad-smelling fluid coming from your vagina. You have pain when you pee. You are exposed to a disease that spreads from person to person, such as chickenpox, measles, Zika virus, HIV, or hepatitis. Get help right away if: You have spotting or bleeding from your vagina. You have very bad belly cramping or pain. You have shortness of breath or chest pain. You have any kind of injury, such as from a fall or a car crash. You have new or increased pain, swelling, or redness in an arm or leg. Summary The first trimester of pregnancy starts on the first day of your last menstrual period until the end of week 12 (months 1 through 3). Eat 4 or 5 small meals a day instead of 3 large meals. Do not smoke or use any products that contain nicotine or tobacco. If you need help quitting, ask your doctor. Keep all follow-up visits. This information is not intended to replace advice given to you by your health care provider. Make sure you discuss any questions you have with your health care provider. Document Revised: 06/23/2019 Document Reviewed: 04/29/2019 Elsevier Patient Education  2024 Elsevier Inc. Common Medications Safe in Pregnancy  Acne:      Constipation:  Benzoyl Peroxide     Colace  Clindamycin      Dulcolax Suppository  Topica Erythromycin     Fibercon  Salicylic Acid      Metamucil         Miralax AVOID:        Senakot   Accutane    Cough:  Retin-A       Cough Drops  Tetracycline      Phenergan w/ Codeine if  Rx  Minocycline      Robitussin (Plain & DM)  Antibiotics:     Crabs/Lice:  Ceclor       RID  Cephalosporins    AVOID:  E-Mycins      Kwell  Keflex  Macrobid/Macrodantin   Diarrhea:  Penicillin      Kao-Pectate  Zithromax      Imodium AD         PUSH FLUIDS AVOID:       Cipro     Fever:  Tetracycline      Tylenol (Regular or Extra  Minocycline       Strength)  Levaquin      Extra Strength-Do not          Exceed 8 tabs/24 hrs Caffeine:        200mg /day (equiv. To 1 cup of coffee or  approx. 3 12 oz  sodas)         Gas: Cold/Hayfever:       Gas-X  Benadryl      Mylicon  Claritin       Phazyme  **Claritin-D        Chlor-Trimeton    Headaches:  Dimetapp      ASA-Free Excedrin  Drixoral-Non-Drowsy     Cold Compress  Mucinex (Guaifenasin)     Tylenol (Regular or Extra  Sudafed/Sudafed-12 Hour     Strength)  **Sudafed PE Pseudoephedrine   Tylenol Cold & Sinus     Vicks Vapor Rub  Zyrtec  **AVOID if Problems With Blood Pressure         Heartburn: Avoid lying down for at least 1 hour after meals  Aciphex      Maalox     Rash:  Milk of Magnesia     Benadryl    Mylanta       1% Hydrocortisone Cream  Pepcid  Pepcid Complete   Sleep Aids:  Prevacid      Ambien   Prilosec       Benadryl  Rolaids       Chamomile Tea  Tums (Limit 4/day)     Unisom         Tylenol PM         Warm milk-add vanilla or  Hemorrhoids:       Sugar for taste  Anusol/Anusol H.C.  (RX: Analapram 2.5%)  Sugar Substitutes:  Hydrocortisone OTC     Ok in moderation  Preparation H      Tucks        Vaseline lotion applied to tissue with wiping    Herpes:     Throat:  Acyclovir      Oragel  Famvir  Valtrex     Vaccines:         Flu Shot Leg Cramps:       *Gardasil  Benadryl      Hepatitis A         Hepatitis B Nasal Spray:       Pneumovax  Saline Nasal Spray     Polio Booster         Tetanus Nausea:       Tuberculosis test or PPD  Vitamin B6 25 mg  TID   AVOID:    Dramamine      *Gardasil  Emetrol       Live Poliovirus  Ginger Root 250 mg QID    MMR (measles, mumps &  High Complex Carbs @ Bedtime    rebella)  Sea Bands-Accupressure    Varicella (Chickenpox)  Unisom 1/2 tab TID     *No known complications           If received before Pain:         Known pregnancy;   Darvocet       Resume series after  Lortab        Delivery  Percocet    Yeast:   Tramadol      Femstat  Tylenol 3      Gyne-lotrimin  Ultram       Monistat  Vicodin           MISC:         All Sunscreens           Hair Coloring/highlights          Insect Repellant's          (Including DEET)  Mystic Tans Common Medications Safe in Pregnancy  Acne:      Constipation:  Benzoyl Peroxide     Colace  Clindamycin      Dulcolax Suppository  Topica Erythromycin     Fibercon  Salicylic Acid      Metamucil         Miralax AVOID:        Senakot   Accutane    Cough:  Retin-A       Cough Drops  Tetracycline      Phenergan w/ Codeine if Rx  Minocycline      Robitussin (Plain & DM)  Antibiotics:     Crabs/Lice:  Ceclor       RID  Cephalosporins    AVOID:  E-Mycins      Kwell  Keflex  Macrobid/Macrodantin   Diarrhea:  Penicillin      Kao-Pectate  Zithromax      Imodium AD         PUSH FLUIDS AVOID:       Cipro     Fever:  Tetracycline      Tylenol (Regular or Extra  Minocycline       Strength)  Levaquin      Extra Strength-Do not          Exceed 8 tabs/24 hrs Caffeine:        200mg /day (equiv. To 1 cup of coffee or  approx. 3 12 oz sodas)         Gas: Cold/Hayfever:       Gas-X  Benadryl      Mylicon  Claritin       Phazyme  **Claritin-D        Chlor-Trimeton    Headaches:  Dimetapp      ASA-Free Excedrin  Drixoral-Non-Drowsy     Cold Compress  Mucinex (Guaifenasin)     Tylenol (Regular or Extra  Sudafed/Sudafed-12 Hour     Strength)  **Sudafed PE Pseudoephedrine   Tylenol Cold & Sinus     Vicks Vapor Rub  Zyrtec  **AVOID if Problems With  Blood Pressure         Heartburn: Avoid lying down for at least 1 hour after meals  Aciphex      Maalox     Rash:  Milk of Magnesia     Benadryl    Mylanta       1% Hydrocortisone Cream  Pepcid  Pepcid Complete   Sleep Aids:  Prevacid      Ambien   Prilosec       Benadryl  Rolaids       Chamomile Tea  Tums (Limit 4/day)     Unisom         Tylenol PM         Warm milk-add vanilla or  Hemorrhoids:       Sugar for taste  Anusol/Anusol H.C.  (RX: Analapram 2.5%)  Sugar Substitutes:  Hydrocortisone OTC     Ok in moderation  Preparation H      Tucks        Vaseline lotion applied to tissue with wiping    Herpes:     Throat:  Acyclovir      Oragel  Famvir  Valtrex     Vaccines:         Flu Shot Leg Cramps:       *Gardasil  Benadryl      Hepatitis A         Hepatitis B  Nasal Spray:       Pneumovax  Saline Nasal Spray     Polio Booster         Tetanus Nausea:       Tuberculosis test or PPD  Vitamin B6 25 mg TID   AVOID:    Dramamine      *Gardasil  Emetrol       Live Poliovirus  Ginger Root 250 mg QID    MMR (measles, mumps &  High Complex Carbs @ Bedtime    rebella)  Sea Bands-Accupressure    Varicella (Chickenpox)  Unisom 1/2 tab TID     *No known complications           If received before Pain:         Known pregnancy;   Darvocet       Resume series after  Lortab        Delivery  Percocet    Yeast:   Tramadol      Femstat  Tylenol 3      Gyne-lotrimin  Ultram       Monistat  Vicodin           MISC:         All Sunscreens           Hair Coloring/highlights          Insect Repellant's          (Including DEET)         Mystic Tans

## 2022-11-04 ENCOUNTER — Ambulatory Visit: Payer: Medicaid Other

## 2022-11-04 ENCOUNTER — Telehealth: Payer: Self-pay | Admitting: Obstetrics and Gynecology

## 2022-11-04 NOTE — Telephone Encounter (Signed)
Reached out to pt to reschedule NOB intake appt that was scheduled on 11/04/2022 at 8:15 with the NOB intake nurse.  Left message for pt to call back to reschedule.

## 2022-11-05 ENCOUNTER — Encounter: Payer: Self-pay | Admitting: Obstetrics and Gynecology

## 2022-11-05 NOTE — Telephone Encounter (Signed)
I contacted the patient via phone. I left message for the patient to call back to rescheduling.

## 2024-02-18 ENCOUNTER — Observation Stay
Admission: EM | Admit: 2024-02-18 | Discharge: 2024-02-18 | Disposition: A | Discharge status: Home / Self Care | Payer: Self-pay | Attending: Obstetrics and Gynecology | Admitting: Obstetrics and Gynecology

## 2024-02-18 ENCOUNTER — Encounter: Payer: Self-pay | Admitting: Obstetrics and Gynecology

## 2024-02-18 ENCOUNTER — Other Ambulatory Visit: Payer: Self-pay

## 2024-02-18 ENCOUNTER — Inpatient Hospital Stay: Admit: 2024-02-18 | Payer: Self-pay

## 2024-02-18 DIAGNOSIS — Z3A25 25 weeks gestation of pregnancy: Secondary | ICD-10-CM | POA: Insufficient documentation

## 2024-02-18 DIAGNOSIS — F17210 Nicotine dependence, cigarettes, uncomplicated: Secondary | ICD-10-CM | POA: Insufficient documentation

## 2024-02-18 DIAGNOSIS — Z7982 Long term (current) use of aspirin: Secondary | ICD-10-CM | POA: Insufficient documentation

## 2024-02-18 DIAGNOSIS — O99332 Smoking (tobacco) complicating pregnancy, second trimester: Secondary | ICD-10-CM | POA: Insufficient documentation

## 2024-02-18 DIAGNOSIS — O368120 Decreased fetal movements, second trimester, not applicable or unspecified: Principal | ICD-10-CM | POA: Diagnosis present

## 2024-02-18 NOTE — OB Triage Note (Signed)
 Darlene Leonard presented to L&D for evaluation after she had a fall this morning around 0730, slipped on dog lease and fell on her back.  Mardi denies any vaginal bleeding or leaking of fluid but states she isn't feeling fetal movement like she normally does around this time of day.

## 2024-02-18 NOTE — Progress Notes (Signed)
 Discharge instructions reviewed with patient and significant other who verbalized understanding. Patient denies any further questions or concerns at this time.  Discharged home with significant other.

## 2024-02-18 NOTE — Discharge Summary (Signed)
 Physician Final Progress Note  Patient ID: Darlene Leonard MRN: [id number] DOB/AGE: 35/09/1990 34 y.o.  Admit date: 02/18/2024 Admitting provider: Garnette JONETTA Mace, MD Discharge date: 02/18/2024   Admission Diagnoses:  1) intrauterine pregnancy at [redacted]w[redacted]d  2) Decreased fetal movement  Discharge Diagnoses:  Principal Problem:   Decreased fetal movement affecting management of pregnancy in second trimester  Reassuring fetal tracing intrauterine pregnancy at [redacted]w[redacted]d   History of Present Illness: The patient is a 34 y.o. female [phone] at [redacted]w[redacted]d who presents for decreased fetal movement after having a fall this morning. She fell and landed on her backside. She had no pain in her abdomen, denies vaginal bleeding, leaking fluid, and contractions. She was worried about the lack of movement from her baby. So, she called the clinic, tried to take food and drink to stimulate movement. She was advised to come to L&D for assessment.  While on the L&D unit she was able to feel the baby moving. .   Past Medical History:  Diagnosis Date   Medical history non-contributory     Past Surgical History:  Procedure Laterality Date   APPENDECTOMY  2011    Medications Ordered Prior to Encounter[1]  Allergies[2]  Social History   Socioeconomic History   Marital status: Single    Spouse name: Not on file   Number of children: Not on file   Years of education: Not on file   Highest education level: Not on file  Occupational History   Not on file  Tobacco Use   Smoking status: Some Days    Current packs/day: 0.00    Types: Cigarettes    Last attempt to quit: 02/06/2014    Years since quitting: 10.0   Smokeless tobacco: Never  Vaping Use   Vaping status: Never Used  Substance and Sexual Activity   Alcohol use: Yes    Alcohol/week: 3.0 - 4.0 standard drinks of alcohol    Types: 3 - 4 Glasses of wine per week    Comment: occas   Drug use: No   Sexual activity: Yes    Partners: Male    Birth  control/protection: None, Pill  Other Topics Concern   Not on file  Social History Narrative   Not on file   Social Drivers of Health   Tobacco Use: Medium Risk (12/30/2023)   Received from [facility]   Patient History    Smoking Tobacco Use: Former    Smokeless Tobacco Use: Never    Passive Exposure: Not on file  Financial Resource Strain: Low Risk  (11/21/2023)   Received from [facility]   Overall Financial Resource Strain (CARDIA)    Difficulty of Paying Living Expenses: Not very hard  Food Insecurity: No Food Insecurity (11/21/2023)   Received from [facility]   Epic    Within the past 12 months, you worried that your food would run out before you got the money to buy more.: Never true    Within the past 12 months, the food you bought just didn't last and you didn't have money to get more.: Never true  Transportation Needs: No Transportation Needs (11/21/2023)   Received from [facility] - Transportation    In the past 12 months, has lack of transportation kept you from medical appointments or from getting medications?: No    Lack of Transportation (Non-Medical): No  Physical Activity: Not on file  Stress: Not on file  Social  Connections: Not on file  Intimate Partner Violence: Not on file  Depression (EYV7-0): Not on file  Alcohol Screen: Not on file  Housing: Low Risk  (11/21/2023)   Received from [facility]   Epic    In the last 12 months, was there a time when you were not able to pay the mortgage or rent on time?: No    In the past 12 months, how many times have you moved where you were living?: 1    At any time in the past 12 months, were you homeless or living in a shelter (including now)?: No  Utilities: Not At Risk (11/21/2023)   Received from [facility]   Epic    In the past 12 months has the electric, gas, oil, or water company threatened  to shut off services in your home?: No  Health Literacy: Not on file    Family History  Problem Relation Age of Onset   Breast cancer Maternal Aunt    Heart disease Mother    Heart disease Paternal Grandmother    Heart disease Maternal Grandmother    Diabetes Father    Diabetes Paternal Uncle      ROS All negative, per RN  Physical Exam: BP 110/61   Pulse 94   Temp 98.3 F (36.8 C) (Oral)   Resp 16   Ht 5' 2 (1.575 m)   Wt 72.6 kg   LMP 09/01/2023   BMI 29.26 kg/m   OBGyn Exam Did not see patient. Report from RN  Consults: None  Significant Findings/ Diagnostic Studies: none  Procedures:  NST: Baseline FHR: 140 beats/min Variability: moderate Accelerations: present Decelerations: absent Tocometry: quiet  Interpretation:  INDICATIONS: decreased fetal movement RESULTS:  A NST procedure was performed with FHR monitoring and a normal baseline established, appropriate time of 20-40 minutes of evaluation, and accels >2 seen w 15x15 characteristics.  Results show a REASSURING NST for gestational age  Hospital Course: The patient was admitted to Labor and Delivery Triage for observation. She had an elevated BP at first check. She has a history of chronic hypertension. Her BP readings were subsequently normal range.  She otherwise had normal vital signs. The fetal tracing was reassuring and she noted active fetal movement. She had no pain issues or other complaints. She was reassured and discharged with routine follow up and RN teaching.  Discharge Condition: good  Disposition: Discharge disposition: 01-Home or Self Care       Diet: Regular diet  Discharge Activity: Activity as tolerated   Allergies as of 02/18/2024   No Known Allergies      Medication List     STOP taking these medications    hydrOXYzine  25 MG tablet Commonly known as: ATARAX    Norethindrone -Ethinyl Estradiol -Fe Biphas 1 MG-10 MCG / 10 MCG tablet Commonly known as: LO LOESTRIN FE        TAKE these medications    aspirin  EC 81 MG tablet Take 81 mg by mouth daily. Swallow whole.   doxylamine (Sleep) 25 MG tablet Commonly known as: UNISOM Take 25 mg by mouth at bedtime. Taken with B6 in evening for nausea   multivitamin-prenatal 27-0.8 MG Tabs tablet Take 1 tablet by mouth daily at 12 noon.   pyridOXINE 25 MG tablet Commonly known as: VITAMIN B6 Take 25 mg by mouth daily. Taken with unisom for nausea   sertraline  50 MG tablet Commonly known as: Zoloft  Take 1.5 tablets (75 mg total) by mouth  daily.         Signed: Garnette Mace, MD  02/18/2024, 11:54 AM     [1]  No current facility-administered medications on file prior to encounter.   Current Outpatient Medications on File Prior to Encounter  Medication Sig Dispense Refill   aspirin  EC 81 MG tablet Take 81 mg by mouth daily. Swallow whole.     doxylamine, Sleep, (UNISOM) 25 MG tablet Take 25 mg by mouth at bedtime. Taken with B6 in evening for nausea     Prenatal Vit-Fe Fumarate-FA (MULTIVITAMIN-PRENATAL) 27-0.8 MG TABS tablet Take 1 tablet by mouth daily at 12 noon.     pyridOXINE (VITAMIN B6) 25 MG tablet Take 25 mg by mouth daily. Taken with unisom for nausea     hydrOXYzine  (ATARAX /VISTARIL ) 25 MG tablet Take 1 tablet (25 mg total) by mouth every 6 (six) hours as needed for anxiety. (Patient not taking: Reported on 02/18/2024) 30 tablet 2   Norethindrone -Ethinyl Estradiol -Fe Biphas (LO LOESTRIN FE) 1 MG-10 MCG / 10 MCG tablet Take 1 tablet by mouth daily. (Patient not taking: Reported on 02/18/2024) 84 tablet 3   sertraline  (ZOLOFT ) 50 MG tablet Take 1.5 tablets (75 mg total) by mouth daily. 135 tablet 3  [2] No Known Allergies

## 2024-03-05 ENCOUNTER — Other Ambulatory Visit: Payer: Self-pay | Admitting: Certified Nurse Midwife

## 2024-03-05 ENCOUNTER — Other Ambulatory Visit (HOSPITAL_COMMUNITY): Payer: Self-pay | Admitting: Certified Nurse Midwife

## 2024-03-05 ENCOUNTER — Encounter: Payer: Self-pay | Admitting: Certified Nurse Midwife

## 2024-03-05 ENCOUNTER — Telehealth (HOSPITAL_COMMUNITY): Payer: Self-pay

## 2024-03-05 DIAGNOSIS — D509 Iron deficiency anemia, unspecified: Secondary | ICD-10-CM

## 2024-03-05 DIAGNOSIS — O99019 Anemia complicating pregnancy, unspecified trimester: Secondary | ICD-10-CM | POA: Insufficient documentation

## 2024-03-05 NOTE — Telephone Encounter (Signed)
 Auth Submission: NO AUTH NEEDED Site of care: Site of care: [facility] Payer: [employer] Medication & CPT/J Code(s) submitted: Venofer (Iron Sucrose) J1756 Diagnosis Code: D50.9, O99.019 Route of submission (phone, fax, portal):  Phone # Fax # Auth type: Buy/Bill HB Units/visits requested: 300mg  once weekly x 3 doses Reference number:  Approval from: 03/05/24 to 06/02/24

## 2024-03-05 NOTE — Progress Notes (Signed)
 Received referral for Venofier 300mg  IV x 3 doses weekly.  SOC: [facility]  Anthone Prieur, PharmD, MPH, BCPS, CPP Clinical Pharmacist

## 2024-03-05 NOTE — Progress Notes (Signed)
 34 year old [id number] at [redacted]w[redacted]d with iron deficiency anemia. Hgb 8.8. Ordered IV Venofer 300mg  weekly x 3 doses. Recheck CBC at 32wks.  Edsel Charlies Blush, CNM 03/05/2024 10:35 AM
